# Patient Record
Sex: Male | Born: 1960 | Race: White | Hispanic: No | Marital: Married | Smoking: Never smoker
Health system: Southern US, Community
[De-identification: ages and names within clinical notes are randomized; demographics above are authoritative.]

## PROBLEM LIST (undated history)

## (undated) DIAGNOSIS — M722 Plantar fascial fibromatosis: Secondary | ICD-10-CM

## (undated) DIAGNOSIS — R011 Cardiac murmur, unspecified: Secondary | ICD-10-CM

## (undated) DIAGNOSIS — K635 Polyp of colon: Secondary | ICD-10-CM

## (undated) DIAGNOSIS — M7710 Lateral epicondylitis, unspecified elbow: Secondary | ICD-10-CM

## (undated) HISTORY — PX: TONSILLECTOMY: SUR1361

## (undated) HISTORY — DX: Polyp of colon: K63.5

## (undated) HISTORY — DX: Plantar fascial fibromatosis: M72.2

## (undated) HISTORY — DX: Lateral epicondylitis, unspecified elbow: M77.10

## (undated) HISTORY — DX: Cardiac murmur, unspecified: R01.1

## (undated) HISTORY — PX: OTHER SURGICAL HISTORY: SHX169

---

## 2007-12-21 ENCOUNTER — Ambulatory Visit: Payer: Self-pay | Admitting: Sports Medicine

## 2007-12-21 DIAGNOSIS — M79609 Pain in unspecified limb: Secondary | ICD-10-CM | POA: Insufficient documentation

## 2008-03-27 ENCOUNTER — Ambulatory Visit: Payer: Self-pay | Admitting: Sports Medicine

## 2008-03-27 DIAGNOSIS — S838X9A Sprain of other specified parts of unspecified knee, initial encounter: Secondary | ICD-10-CM | POA: Insufficient documentation

## 2008-03-27 DIAGNOSIS — S86819A Strain of other muscle(s) and tendon(s) at lower leg level, unspecified leg, initial encounter: Secondary | ICD-10-CM

## 2008-03-28 ENCOUNTER — Telehealth: Payer: Self-pay | Admitting: Sports Medicine

## 2008-04-27 ENCOUNTER — Ambulatory Visit: Payer: Self-pay | Admitting: Sports Medicine

## 2008-10-18 ENCOUNTER — Ambulatory Visit: Payer: Self-pay | Admitting: Sports Medicine

## 2008-10-18 DIAGNOSIS — M7660 Achilles tendinitis, unspecified leg: Secondary | ICD-10-CM | POA: Insufficient documentation

## 2008-11-21 ENCOUNTER — Ambulatory Visit: Payer: Self-pay | Admitting: Sports Medicine

## 2009-06-03 ENCOUNTER — Ambulatory Visit: Payer: Self-pay | Admitting: Sports Medicine

## 2009-07-02 ENCOUNTER — Ambulatory Visit: Payer: Self-pay | Admitting: Sports Medicine

## 2009-07-02 DIAGNOSIS — M722 Plantar fascial fibromatosis: Secondary | ICD-10-CM | POA: Insufficient documentation

## 2010-01-16 ENCOUNTER — Ambulatory Visit: Payer: Self-pay | Admitting: Sports Medicine

## 2010-02-06 ENCOUNTER — Ambulatory Visit: Payer: Self-pay | Admitting: Sports Medicine

## 2010-05-19 ENCOUNTER — Ambulatory Visit
Admission: RE | Admit: 2010-05-19 | Discharge: 2010-05-19 | Payer: Self-pay | Source: Home / Self Care | Attending: Sports Medicine | Admitting: Sports Medicine

## 2010-05-20 NOTE — Assessment & Plan Note (Signed)
Summary: HEEL PAIN,MC   Vital Signs:  Patient profile:   50 year old male Height:      67 inches Weight:      150 pounds BP sitting:   118 / 73  Vitals Entered By: Enid Baas MD (June 03, 2009 10:38 AM)  Primary Provider:  Roanna Epley, MD   History of Present Illness: 50 y/o male presents for F/U of Left heel pain.  Pt last visit was in August 2010.  U/S at last visit showed small hematoma at distal achilles and fat pad.  Pt states that pain is on the posterior/plantar aspect of heel near achilles insertion.  Pain is worse with initial steps in the morning and gets easier to walk as day progresses.  Pain is worse for the days following a long run but only occational pains during the run that last for no more than a couple seconds. Pt states that he is not a heel-striker when running but rather lands on his midfoot.  Pt uses prescribed heel cups and inserts in his shoes.  Uses ibuprofen and ices the left heel but only gets releif after a couple day after the long run.  Pt states that pain now lasts longer after a run and is more intense.  Pt also states that he has tight hamstrings and calves for which he does exercises and strengthening.   Physical Exam  General:  alert, well-developed, and well-nourished.   Msk:  Left Ankle/foot: -no signs of edema -TTP at plantar base of calcaneous, non-tender along plantar fascia or along achilles tendon.  -FROM -5/5 strength   Bilateral Hip/Leg/ Knee: -no leg length descrepencies -FROM -4/5 strength in hip abduction bilaterally  Additional Exam:  US exam of heel this is repeated over left heel AT is normal at 0.41 cms PF is normal at 0.35 cms fat pad no longer shows hematoma but there may be a small area of split fat pad no inc doppler flow   Impression & Recommendations:  Problem # 1:  HEEL PAIN, LEFT (ICD-729.5) Assessment Unchanged This is somewhat less than before but persistent this is most consistent with split fat  pad [place] firm [place] not sure that we have many options except heel cushion icing at end of day  Problem # 2:  ACHILLES BURSITIS OR TENDINITIS (ICD-726.71) Assessment: Improved These look normal today and I do not think this is part of pain pattern  Complete Medication List: 1)  Voltaren 1 % Gel (Diclofenac sodium) .... Apply to left heel qid 1 gram  Appended Document: HEEL PAIN,MC D/w patient.  will consider making custom orthotic if this persistes and reck [place] to be sure PF is not starting to breakdown .. suspect he changed his form from split fat pad.

## 2010-05-20 NOTE — Assessment & Plan Note (Signed)
Summary: CALF STRAIN,MC   Vital Signs:  Patient profile:   50 year old male Pulse rate:   41 / minute BP sitting:   123 / 77  (left arm) CC: left calf pain   Primary Jillisa Harris:  Roanna Epley, MD  CC:  left calf pain.  History of Present Illness: Patient with left calf pain and tightness since July.  Pain occurs mid calf, and feels deep.  Patient describes pain as an aching at first and then progresses to tightness.  Has tried icing and resting 4-5 days at a time, states the discomfort improves with rest, but progresses again with running.  Denies tearing sensation.  Does not recall specific injury or trauma.  Denies numbness/tingling.  Denies bruising or swelling.  Runs 20-25 miles per week.  Training for [facility] 1/2 Marathon 02/09/10. PMH-FH-SH reviewed for relevance  Review of Systems General:  Denies chills, fatigue, fever, weakness, and weight loss. CV:  Denies bluish discoloration of lips or nails, leg cramps with exertion, swelling of feet, and swelling of hands. Resp:  Denies chest pain with inspiration, cough, and shortness of breath. MS:  Complains of muscle aches and stiffness; denies joint pain, joint redness, joint swelling, loss of strength, low back pain, mid back pain, cramps, muscle weakness, and thoracic pain. Derm:  Denies changes in color of skin, changes in nail beds, itching, lesion(s), and rash. Neuro:  Denies brief paralysis, falling down, numbness, poor balance, tingling, tremors, and weakness. Heme:  Denies abnormal bruising and skin discoloration. Allergy:  Denies hives or rash and seasonal allergies.  Physical Exam  General:  Well-developed,well-nourished,in no acute distress; alert,appropriate and cooperative throughout examination Msk:  Left Foot and Ankle: Left calf with TTP along lateral gastroc mid-way between knee & ankle.  No palpable defects.  No visible calf asymetry.  Able to perform heel raises.  Thompson test produces expected plantar  flexion. mild TTP along medial & mid portion of plantar fascia at the heel.   Full ROM of the ankle in all directions 5/5 strength with resisted ankle ROM Mild pes planus with lateral transverse arch breakdown  Right Foot and Ankle: Normal inspection No TTP throughout, no TTP along lateral gastroc. Full ROM of the ankle in all directions 5/5 strength with resisted ankle ROM Mild pes planus with lateral transverse arch breakdown Pulses:  +2/4 DP & PT b/l Neurologic:  sensation intact to light touch.   Additional Exam:  MSK U/S: Left calf - lateral gastroc with partial tear noted midway between knee & ankle.  Increased edema noted in that area.  Increased blood flow noted with doppler imaging.  No abnormalites of achilles tendon.  Comparison to contralateral side revealed normal appearing right gastroc without abnormality.  Also normal appearing achilles on right.  Images saved.   Impression & Recommendations:  Problem # 1:  CALF PAIN, LEFT (ICD-729.5)  - Partial lateral gastroc tear noted on MSK U/S - Start NTG protocol - apply 1/4 patch to lateral calf daily.  Reviewed common side-effects. - Fitted with Body Helix calf sleeve - should wear with activity & throughout the day to decrease swelling/fluid accumulation - Additional felt lift added to lift already in shoe.  Additional lift given for other shoes. - Re-educated on eccentric calf exercises & handout given. - Ok to continue running, should stop if experiencing increased pain or if limping. - f/u prior to [facility] 1/2 Marathon which is 02/09/10.  Plan for repeat MSK U/S at that time.  Orders:  [place] LIMITED ([id number])  Problem # 2:  HEEL PAIN, LEFT (ICD-729.5) - Additional felt heel lift added to left shoe.  Additional lift given for other shoes.  Orders: Foot Orthosis ( Arch Strap/Heel Cup) [phone])  Complete Medication List: 1)  Voltaren 1 % Gel (Diclofenac sodium) .... Apply to left heel qid 1 gram 2)  Nitroglycerin 0.2  Mg/hr Pt24 (Nitroglycerin) .... Apply 1/4 patch to lateral left calf daily  Other Orders: Garment,belt,sleeve or other covering ,elastic or similar stretch (Z6606)  Patient Instructions: 1)  You have a small partial calf tear noted on ultrasound. 2)  Start using Nitroglycerin patch - 1/4 patch to left lateral calf in area of tenderness daily.  Re-apply new patch daily.  Most common side-effect is headache. 3)  Use calf sleeve with activity to decrease swelling & fluid accumulation. 4)  Wear new heel lift in left shoe, apply additional lift in other shoes. 5)  Do calf exercises 3 sets of 15 of calf raises. 6)  Ok to continue running/training, but should stop if having increased pain and/or limping. 7)  Follow-up prior to half marathon which is 02/09/10. Prescriptions: NITROGLYCERIN 0.2 MG/HR PT24 (NITROGLYCERIN) apply 1/4 patch to lateral left calf daily  #12 x 2   Entered and Authorized by:   Darene Lamer MD   Signed by:   Darene Lamer MD on 01/16/2010   Method used:   Electronically to        [facility] [phone]* (retail)       [address] [place], [place]  [address]       Ph: [phone]       Fax: [phone]   RxID:   [id number]

## 2010-05-20 NOTE — Assessment & Plan Note (Signed)
Summary: F/U HEEL PAIN,MC   Vital Signs:  Patient profile:   50 year old male Pulse rate:   46 / minute BP sitting:   126 / 84  Vitals Entered By: Lillia Pauls CMA (July 02, 2009 9:35 AM)  Primary Provider:  Roanna Epley, MD   History of Present Illness: Pt presents for left posterior heel pain that has been worse for the past three weeks since he ran a half marathon in [facility]. He had previously been found to have a small hematoma of his left posterior heel that was near his achilles tendon after walking quickly through a [place] airport. The pain that he currently has is different and worse than his previous pain. He now has difficulty with walking and running. His pain is worse with his first steps in the morning and after sitting for a while. His pain is located on the plantar surface of his left foot at his heel.   Physical Exam  General:  alert and well-developed.   Head:  normocephalic and atraumatic.   Neck:  supple.   Heart:  normal rate.   Msk:  Left Foot and Ankle: Normal inspection + TTP along the medial and mid portion of the plantar fascia at the heel Full ROM of the ankle in all directions 5/5 strength with resisted ankle ROM Pain with weight bearing Mild pes planus with lateral transverse arch breakdown  Right Foot and Ankle: Normal inspection No TTP throughout Full ROM of the ankle in all directions 5/5 strength with resisted ankle ROM No pain with weight bearing Mild pes planus with lateral transverse arch breakdown Additional Exam:  U/S of the left achilles tendon shows a small gap on the transverse view with mild edema and limited doppler flow. Examinaiton of his left plantar fascia shows a small gap at the insertion point with a small amount of calcification. Mildly increased doppler flow is appreciated. Thickness of left plantar fascia is 0.34cm. Exam of the right plantar fasica shows no edema, tears or calcification. Thickness of the plantar fascia is  0.33cm.  Images saved for documentation.   Impression & Recommendations:  Problem # 1:  HEEL PAIN, LEFT (ICD-729.5)  Mainly due to his plantar fasciitis of the left heel See below for treatment plan  Orders: [place] LIMITED ([id number]) Garment,belt,sleeve or other covering ,elastic or similar stretch (U0454)  Problem # 2:  PLANTAR FASCIITIS (ICD-728.71)  1. Continue current sports insoles in shoes at all times. 2. Given a new heel pad to wear in his dress shoes today. 3. Given an arch band to wear at all times 4. Continue calf and arch stretches and exercises as previously shown 5. Ice foot in bucket of ice water for 10 minutes daily 6. Return in one month for follow-up  Orders: [place] LIMITED ([id number]) Garment,belt,sleeve or other covering ,elastic or similar stretch (B1478)  Problem # 3:  ACHILLES BURSITIS OR TENDINITIS (ICD-726.71)  Improved since last visit. Small hematoma still seen with a small gap on ultrasound. Continue heel pads and sports insoles with daily exercises.  Orders: [place] LIMITED ([id number])  Complete Medication List: 1)  Voltaren 1 % Gel (Diclofenac sodium) .... Apply to left heel qid 1 gram

## 2010-05-20 NOTE — Assessment & Plan Note (Signed)
Summary: f/u,mc   Vital Signs:  Patient profile:   50 year old male Height:      67 inches Weight:      153 pounds BMI:     24.05 BP sitting:   120 / 80  Vitals Entered By: Lillia Pauls CMA (February 06, 2010 10:00 AM)  Primary Provider:  Roanna Epley, MD   History of Present Illness: He found extra lift on left too much still using sports insole w heel lift at 1 cm  calf compression sleeve very comfortable  no pain on 10 mile run this week did gets some DOMS 1 hour p run  exercises have gone well on 2 legs  Allergies (verified): No Known Drug Allergies  Physical Exam  General:  Well-developed,well-nourished,in no acute distress; alert,appropriate and cooperative throughout examination Msk:  no calf tenderness or swelling  normal appearance  MSK [place] There is increase in doppler flow and vessels into area of injury compared to last vist hypoechoic area is smaller there appears to be better mm fiber in area there is still  1 section that remains mostly hypoechoic   Impression & Recommendations:  Problem # 1:  CALF PAIN, LEFT (ICD-729.5)  use calf compression on both calves Will add to RT as this one is getting somewhat tight but not tender like left was  work exercises to 1 leg after half marathon done  keep on ntg another 6 wks and reck after that  will repeat scan on rtc to see if hypoechoic area totally heals but may be able to wean ntg at that point as this seems to be healing progressively  Orders: [place] LIMITED ([id number])  Complete Medication List: 1)  Voltaren 1 % Gel (Diclofenac sodium) .... Apply to left heel qid 1 gram 2)  Nitroglycerin 0.2 Mg/hr Pt24 (Nitroglycerin) .... Apply 1/4 patch to lateral left calf daily  Other Orders: Garment,belt,sleeve or other covering ,elastic or similar stretch (U0454)   Orders Added: 1)  Garment,belt,sleeve or other covering ,elastic or similar stretch [A4466] 2)  Est. Patient Level III [09811] 3)  [place] LIMITED  [[id number]]

## 2010-05-28 NOTE — Assessment & Plan Note (Signed)
Summary: CALF PAIN/ACHILLES TENDON/MJD   Vital Signs:  Patient profile:   50 year old male BP sitting:   132 / 83  Vitals Entered By: Lillia Pauls CMA (May 19, 2010 11:11 AM)  Primary Provider:  Roanna Epley, MD   History of Present Illness: Left AT and calf had healed as of Oct and he ran half marathon  2 weeks ago running a 9 miler and did not drink RT calf felt tight toward end of run later had pain in lower calf and into AT whenever he tried to run again not able to run last 2 wks had been biking and doing pool running  Ran mile for [employer] - 4:18    Allergies: No Known Drug Allergies  Physical Exam  General:  Well-developed,well-nourished,in no acute distress; alert,appropriate and cooperative throughout examination Msk:  on heel raise and lowering on step he can get just past neutral but minimal dorsiflexion of ankle Tender at intersection of gastrocs / top of AT on RT about 6 in above insertion no TTP over AT itselg good strength and definition  RT is now unremarkable Additional Exam:  MSK [place] AT is normal at 0.47 w no tears inetersection area reveals some sclerotic - prob scar tissue there is inc doppler flow and small defect in this area no tear of fascia or tendon noted   Impression & Recommendations:  Problem # 1:  CALF PAIN, BILATERAL (ICD-729.5)  The RT is now problematic while left is doing well  calf sleeves help  Orders: [place] LIMITED ([id number])  Problem # 2:  ACHILLES BURSITIS OR TENDINITIS (ICD-726.71)  high area of tendon near intersection is painful and partial tearing  restart calf exercises  trial of NTG protocal on RT - worked on left  modify workouts w xtrain x 12 more week and tehn ease bak in  rescn 6 wks  Orders: [place] LIMITED ([id number])  Complete Medication List: 1)  Voltaren 1 % Gel (Diclofenac sodium) .... Apply to left heel qid 1 gram 2)  Nitroglycerin 0.2 Mg/hr Pt24 (Nitroglycerin) .... 1/4 patch daily x 24 hours as  directed Prescriptions: NITROGLYCERIN 0.2 MG/HR PT24 (NITROGLYCERIN) 1/4 patch daily x 24 hours as directed  #30 x 1   Entered and Authorized by:   Enid Baas MD   Signed by:   Enid Baas MD on 05/19/2010   Method used:   Electronically to        [facility] [phone]* (retail)       [address] [place], [place]  [address]       Ph: [phone]       Fax: [phone]   RxID:   [id number]    Orders Added: 1)  Est. Patient Level III [36644] 2)  [place] LIMITED [[id number]]

## 2010-06-11 ENCOUNTER — Encounter (INDEPENDENT_AMBULATORY_CARE_PROVIDER_SITE_OTHER): Payer: Self-pay | Admitting: *Deleted

## 2010-06-17 NOTE — Miscellaneous (Signed)
  Clinical Lists Changes  Orders: Added new Referral order of Physical Therapy Referral (PT) - Signed     Pt requested referral to [facility] in [place]- [phone].  Authorized by Dr. Darrick Penna.

## 2010-07-31 ENCOUNTER — Ambulatory Visit (INDEPENDENT_AMBULATORY_CARE_PROVIDER_SITE_OTHER): Payer: PRIVATE HEALTH INSURANCE | Admitting: Sports Medicine

## 2010-07-31 ENCOUNTER — Encounter: Payer: Self-pay | Admitting: Sports Medicine

## 2010-07-31 VITALS — BP 119/80

## 2010-07-31 DIAGNOSIS — G5790 Unspecified mononeuropathy of unspecified lower limb: Secondary | ICD-10-CM

## 2010-07-31 DIAGNOSIS — M792 Neuralgia and neuritis, unspecified: Secondary | ICD-10-CM

## 2010-07-31 DIAGNOSIS — M79609 Pain in unspecified limb: Secondary | ICD-10-CM

## 2010-07-31 NOTE — Assessment & Plan Note (Signed)
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 The Achilles tendon itself looks completely normal with no abnormal Doppler activity in the examination is completely normal. With this in mind we will try conservative approach using heat compression wraps vitamin B6 and topical capsacian cream to the calf. He is to try this one month. If no improvement I would probably use a different medication to block the nerve irritation.

## 2010-07-31 NOTE — Assessment & Plan Note (Signed)
Patient has had recurrent calf strains. The most recent one which involved the right lateral gastrocnemius muscle seems to have healed well but the scar tissue is in the vicinity of the sural nerve. I suspect based on abnormal Doppler activity that this is causing some irritation of the nerve during exercise.

## 2010-07-31 NOTE — Progress Notes (Signed)
  Subjective:    Patient ID: Roberto Martin, male    DOB: Sep 14, 1960, 50 y.o.   MRN: [id number]  HPI  Pt presents to clinic for evaluation of rt achilles pain.  Aches during runs, does not have to stop running due to pain.  Does not hurt after runs, and does not experience swelling.  Continues with HEP without problems.  Next atopic pain presented after his recent right lateral gastroc tear. The nitroglycerin patches seem to work well with that and the tear healed fairly quickly. However shortly after he experienced a calf tear he was having some of the Del radiating pain down to his Achilles tendon. This does not hurt him at night.   Review of Systems     Objective:   Physical Exam     No swelling either AT Neither is tender Neg thompson's test Good ankle motion bilat Normal gait Good post tib function  Musculoskeletal ultrasound Scans of the right Achilles tendon reveals a normal AP diameter of 0.4 cm. The tendon is intact. There is no abnormal Doppler activity and no swelling noted. Scan of the right lateral gastrocnemius does reveal an area of some scar tissue. This sits proximal to the sural nerve. Of interest there is still no multivessel activity all around this scar tissue   Assessment & Plan:

## 2010-07-31 NOTE — Patient Instructions (Addendum)
Start taking Vitamin B6 100 mg daily Use compression sleeves Do not apply ice to calf Massage Capsaicin cream on calf 2-3 times per day at area of previous strain Let us know how this is working in 4 weeks

## 2010-09-03 ENCOUNTER — Ambulatory Visit (INDEPENDENT_AMBULATORY_CARE_PROVIDER_SITE_OTHER): Payer: PRIVATE HEALTH INSURANCE | Admitting: Sports Medicine

## 2010-09-03 ENCOUNTER — Encounter: Payer: Self-pay | Admitting: Sports Medicine

## 2010-09-03 VITALS — BP 125/81

## 2010-09-03 DIAGNOSIS — M722 Plantar fascial fibromatosis: Secondary | ICD-10-CM

## 2010-09-03 DIAGNOSIS — M7660 Achilles tendinitis, unspecified leg: Secondary | ICD-10-CM

## 2010-09-03 NOTE — Assessment & Plan Note (Signed)
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 Today we modified his sports and soles but cutting a doughnut over the cystic area. He is to try using this and also running on the treadmill the next 2 weeks. If we can cushion this so that he does not get pressure over the bony defect I think he will be able to run without significant pain.  He needs to avoid slopes surfaces were extremely hard surfaces so that he does not reinjure this calcaneal cyst.

## 2010-09-03 NOTE — Progress Notes (Signed)
  Subjective:    Patient ID: Roberto Martin, male    DOB: Apr 20, 1961, 50 y.o.   MRN: [id number]  HPI Roberto Martin returns for followup of a tear in the distal calf close to the right Achilles tendon. This is improved as he has done the exercises and he briefly used nitroglycerin until most the pain was gone. He is using to person cells with a bit of a heel lift.  However several times per week he may run on an indoor track that is about 12 laps to the mile and is banked.  He has a history of chronic left heel pain which in the past was diagnosed as plantar fasciitis. However this has never completely resolved over the past 2 years. After a 4 mi run last week he has severe pain in the left heel that may difficult to walk. He used the [employer] which helped somewhat the pain.  He has moved to running on the treadmill and that is much less painful.   Review of Systems     Objective:   Physical Exam    patient is in no acute distress and is pleasant and cooperative Right calf appears normal to inspection. The Achilles tendon is nontender and is normal to palpation. No nodules are seen. He has good strength.  Left heel shows no pain at the insertion of the plantar fascia. There is some direct tenderness just lateral to the os calcis in the middle of the heel. No swelling is noted.  Musculoskeletal ultrasound Right calf shows what looks like some scar tissue in the area of his prior injury. There is still a very slight increase in Doppler flow. There is no evidence of tearing. The Achilles tendon looks normal and measures to be 0.4 cm.  Left heel shows a normal-appearing plantar fascia with a width of 0.4 cm. Just distal to the insertion of the plantar fascia there some hypoechoic change in the soft tissue and an area of linear hypoechoic change that looks like a fixed split fat pad. Just lateral to the os calcis is an area of cystic irregularity in the calcaneus that now has calcific change  surrounding the defect.    Assessment & Plan:

## 2010-09-03 NOTE — Assessment & Plan Note (Signed)
This is much improved so I suggested that he continue using the same inserts. Continue  calf exercises. I would suggest running more on soft surfaces and getting off the indoor track.

## 2011-04-07 ENCOUNTER — Ambulatory Visit (INDEPENDENT_AMBULATORY_CARE_PROVIDER_SITE_OTHER): Payer: PRIVATE HEALTH INSURANCE | Admitting: Sports Medicine

## 2011-04-07 ENCOUNTER — Encounter: Payer: Self-pay | Admitting: Sports Medicine

## 2011-04-07 VITALS — BP 127/84 | HR 52

## 2011-04-07 DIAGNOSIS — M79609 Pain in unspecified limb: Secondary | ICD-10-CM

## 2011-04-07 DIAGNOSIS — M722 Plantar fascial fibromatosis: Secondary | ICD-10-CM

## 2011-04-07 NOTE — Assessment & Plan Note (Signed)
I recommended continuing to use padding that makes her feel more comfortable  Try an arch strep to see if this is helpful  Easy plantar fascia stretches and continue his calf exercises  I don't think this will go away because it seems to involve the bone and not the plantar fascia  Recheck when necessary

## 2011-04-07 NOTE — Progress Notes (Signed)
  Subjective:    Patient ID: Roberto Martin, male    DOB: 10-26-1960, 50 y.o.   MRN: [id number]  HPI  Patient was last seen in May with left heel pain We'll set her originally, when he was running a lot but the pain started when he was walking through an airport where he had to do a lot of walking He had pain directly under his heel This did not respond to standard treatments  When I saw him in May he does look to be a bony injury to his os calcis with a bone cyst present on his ultrasound scan  He now has some morning stiffness and pain and wonders if he could be having plantar fasciitis or if this is still the same problem He was able to round but does better if he runs every other day  Review of Systems     Objective:   Physical Exam  No acute distress Good calf strength left and right Moderate arch No tenderness on palpation of the plantar fascia itself There is some tenderness in the mid left calcaneus near the os calcis No swelling  MSK ultrasound The plantar fascial membrane continues to look normal He still has a bony defect in the calcaneus at the os calcis This looks like a small erosion or bone cyst and possibly could be an incompletely healed chronic minor fracture      Assessment & Plan:

## 2011-07-28 ENCOUNTER — Ambulatory Visit (INDEPENDENT_AMBULATORY_CARE_PROVIDER_SITE_OTHER): Payer: Managed Care, Other (non HMO) | Admitting: Sports Medicine

## 2011-07-28 VITALS — BP 112/70

## 2011-07-28 DIAGNOSIS — M25529 Pain in unspecified elbow: Secondary | ICD-10-CM | POA: Insufficient documentation

## 2011-07-28 DIAGNOSIS — M7711 Lateral epicondylitis, right elbow: Secondary | ICD-10-CM | POA: Insufficient documentation

## 2011-07-28 DIAGNOSIS — M7710 Lateral epicondylitis, unspecified elbow: Secondary | ICD-10-CM

## 2011-07-28 MED ORDER — NITROGLYCERIN 0.2 MG/HR TD PT24: MEDICATED_PATCH | TRANSDERMAL | Status: DC

## 2011-07-28 NOTE — Assessment & Plan Note (Signed)
Use compression sleeve during day  Not severe and will not consider injection unless no response to other Tx  Use OTC meds

## 2011-07-28 NOTE — Patient Instructions (Addendum)
Please restart 1/4 nitroglycerin patch on right elbow daily.  Nitroglycerin Protocol   Apply 1/4 nitroglycerin patch to affected area daily.  Change position of patch within the affected area every 24 hours.  You may experience a headache during the first 1-2 weeks of using the patch, these should subside.  If you experience headaches after beginning nitroglycerin patch treatment, you may take your preferred over the counter pain reliever.  Another side effect of the nitroglycerin patch is skin irritation or rash related to patch adhesive.  Please notify our office if you develop more severe headaches or rash, and stop the patch.  Tendon healing with nitroglycerin patch may require 12 to 24 weeks depending on the extent of injury.  Men should not use if taking Viagra, Cialis, or Levitra.   Do not use if you have migraines or rosacea.   Please do suggested exercises daily.  Follow up in 6 weeks  Thank you for seeing [place] today!

## 2011-07-28 NOTE — Assessment & Plan Note (Signed)
Begin a NTG protocol  Begin eccentric exercise protocol  Repeat scan in 4 to 6 weeks and reck at that visit

## 2011-07-28 NOTE — Progress Notes (Signed)
  Subjective:    Patient ID: Roberto Martin, male    DOB: 1960/09/15, 51 y.o.   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

## 2011-09-08 ENCOUNTER — Ambulatory Visit: Payer: Managed Care, Other (non HMO) | Admitting: Sports Medicine

## 2011-09-24 ENCOUNTER — Ambulatory Visit: Payer: Managed Care, Other (non HMO) | Admitting: Sports Medicine

## 2011-10-02 ENCOUNTER — Encounter: Payer: Self-pay | Admitting: Family Medicine

## 2011-10-02 ENCOUNTER — Ambulatory Visit (INDEPENDENT_AMBULATORY_CARE_PROVIDER_SITE_OTHER): Payer: Managed Care, Other (non HMO) | Admitting: Family Medicine

## 2011-10-02 VITALS — BP 119/83 | HR 49 | Temp 97.8°F | Ht 67.0 in | Wt 155.0 lb

## 2011-10-02 DIAGNOSIS — M7710 Lateral epicondylitis, unspecified elbow: Secondary | ICD-10-CM

## 2011-10-02 DIAGNOSIS — M7711 Lateral epicondylitis, right elbow: Secondary | ICD-10-CM

## 2011-10-02 NOTE — Patient Instructions (Addendum)
You have lateral epicondylitis Try to avoid painful activities as much as possible (wrist, finger extension, turning hand to the outside). Ice the area 3-4 times a day for 15 minutes at a time. Tylenol or aleve as needed for pain. Counterforce brace as directed can help unload area - wear this regularly if it provides you with relief. Home Pronation/supination with 1 pound weight, wrist extension, stretching - do these once a day - work up to 3 sets of 10 of strengthening exercises, 3-5 stretches holding for 10-20 seconds. Consider formal PT. Consider injection for short term pain relief if the above is not helping. If you use the patches don't use viagra for at least 5 days after stopping them - they will bottom out your blood pressure if you use them together. Surgery is a consideration if exercises and injection are not providing enough relief. Follow up with me in 6 weeks for reevaluation.

## 2011-10-03 ENCOUNTER — Encounter: Payer: Self-pay | Admitting: Family Medicine

## 2011-10-03 NOTE — Assessment & Plan Note (Signed)
Counterforce brace provided.  Emphasized need to do home exercises and demonstrated these again.  He was recently prescribed viagra but hasn't taken - stressed that he cannot take this and do nitro patches.  He's going to focus on exercises, icing, nsaids, tylenol as needed.  F/u in 6 weeks for reevaluation.  Consider trying the patches, injection, formal PT if not improving.

## 2011-10-03 NOTE — Progress Notes (Signed)
  Subjective:    Patient ID: Roberto Martin, male    DOB: [birth year], 51 y.o.   MRN: [id number]  PCP: [facility]  HPI 51 yo M here for right elbow pain.  Patient reports pain started 3 months ago. Thinks it started after he was moving furniture. Difficulty gripping anything heavy with his right hand. Using mouse bothers his elbow. Pain lateral elbow that radiates down some. Writes with right hand, throws with left. No swelling or bruising. Occasionally taking ibuprofen. Seen at [facility] office - shown home exercises (not doing regularly) and given rx for nitroglycerin (has tried a couple times). Feels about the same as last visit.  History reviewed. No pertinent past medical history.  Current Outpatient Prescriptions on File Prior to Visit  Medication Sig Dispense Refill  . nitroGLYCERIN (NITRODUR - DOSED IN MG/24 HR) 0.2 mg/hr 1/4 patch daily.  Change patch every 24 hours as directed.  30 patch  1    History reviewed. No pertinent past surgical history.  No Known Allergies  History   Social History  . Marital Status: Single    Spouse Name: N/A    Number of Children: N/A  . Years of Education: N/A   Occupational History  . Not on file.   Social History Main Topics  . Smoking status: Never Smoker   . Smokeless tobacco: Not on file  . Alcohol Use: Not on file  . Drug Use: Not on file  . Sexually Active: Not on file   Other Topics Concern  . Not on file   Social History Narrative  . No narrative on file    Family History  Problem Relation Age of Onset  . Heart attack Father   . Hyperlipidemia Neg Hx   . Hypertension Neg Hx   . Sudden death Neg Hx     BP 119/83  Pulse 49  Temp 97.8 F (36.6 C) (Oral)  Ht 5\' 7"  (1.702 m)  Wt 155 lb (70.308 kg)  BMI 24.28 kg/m2  Review of Systems See HPI above.    Objective:   Physical Exam Gen: NAD  R elbow: No swelling, bruising, deformity. TTP lateral epicondyle and just distal to this.  No other TTP about  elbow. FROM. Collateral ligaments intact. Pain reproduced with wrist and 3rd finger extension, less so with supination. NVI distally.  L elbow: FROM without pain.    Assessment & Plan:  1. Right elbow lateral epicondylitis - Counterforce brace provided.  Emphasized need to do home exercises and demonstrated these again.  He was recently prescribed viagra but hasn't taken - stressed that he cannot take this and do nitro patches.  He's going to focus on exercises, icing, nsaids, tylenol as needed.  F/u in 6 weeks for reevaluation.  Consider trying the patches, injection, formal PT if not improving.

## 2012-02-24 ENCOUNTER — Ambulatory Visit (INDEPENDENT_AMBULATORY_CARE_PROVIDER_SITE_OTHER): Payer: Managed Care, Other (non HMO) | Admitting: Family Medicine

## 2012-02-24 VITALS — BP 118/84 | Ht 67.0 in | Wt 155.0 lb

## 2012-02-24 DIAGNOSIS — IMO0002 Reserved for concepts with insufficient information to code with codable children: Secondary | ICD-10-CM

## 2012-02-24 DIAGNOSIS — S76311A Strain of muscle, fascia and tendon of the posterior muscle group at thigh level, right thigh, initial encounter: Secondary | ICD-10-CM

## 2012-02-25 ENCOUNTER — Encounter: Payer: Self-pay | Admitting: Family Medicine

## 2012-02-25 DIAGNOSIS — S76311A Strain of muscle, fascia and tendon of the posterior muscle group at thigh level, right thigh, initial encounter: Secondary | ICD-10-CM | POA: Insufficient documentation

## 2012-02-25 NOTE — Assessment & Plan Note (Signed)
Shown home exercises and handout provided.  Has a compression wrap - encouraged to use this with exercise.  Discussed heat/ice.  Avoid hills, speed work for next 4 weeks as he rehabilitates this area.  Nsaids, tylenol as needed.  F/u in 1 month to 6 weeks.  Consider formal PT, nitro if not improving as expected.

## 2012-02-25 NOTE — Progress Notes (Signed)
  Subjective:    Patient ID: Roberto Martin, male    DOB: [birth year], 51 y.o.   MRN: [id number]  PCP: [facility]  HPI 51 yo M here for right hamstring pain  Patient reports about 2 months ago he recalls tweaking right hamstring during a run within middle part of hamstring. No bruising or swelling. Has done some home exercises, wrapping, stretching and overall has been improving. However still gets tightness, pain especially after doing a couple days of running back to back. Hurts with firm palpation of the area (like when sitting on toilet). Not taking any medications for this. Ices area.  History reviewed. No pertinent past medical history.  Current Outpatient Prescriptions on File Prior to Visit  Medication Sig Dispense Refill  . nitroGLYCERIN (NITRODUR - DOSED IN MG/24 HR) 0.2 mg/hr 1/4 patch daily.  Change patch every 24 hours as directed.  30 patch  1    History reviewed. No pertinent past surgical history.  No Known Allergies  History   Social History  . Marital Status: Single    Spouse Name: N/A    Number of Children: N/A  . Years of Education: N/A   Occupational History  . Not on file.   Social History Main Topics  . Smoking status: Never Smoker   . Smokeless tobacco: Not on file  . Alcohol Use: Not on file  . Drug Use: Not on file  . Sexually Active: Not on file   Other Topics Concern  . Not on file   Social History Narrative  . No narrative on file    Family History  Problem Relation Age of Onset  . Heart attack Father   . Hyperlipidemia Neg Hx   . Hypertension Neg Hx   . Sudden death Neg Hx     BP 118/84  Ht 5\' 7"  (1.702 m)  Wt 155 lb (70.308 kg)  BMI 24.28 kg/m2  Review of Systems See HPI above.    Objective:   Physical Exam Gen: NAD  R leg: No gross deformity, swelling, bruising, atrophy. TTP mildly midportion post thigh between hamstrings.  No palpable defect. FROM knee. 5/5 strength with knee flexion at 90 and 30 degrees with  mild pain. NVI distally.    Assessment & Plan:  1. Right hamstring strain - Shown home exercises and handout provided.  Has a compression wrap - encouraged to use this with exercise.  Discussed heat/ice.  Avoid hills, speed work for next 4 weeks as he rehabilitates this area.  Nsaids, tylenol as needed.  F/u in 1 month to 6 weeks.  Consider formal PT, nitro if not improving as expected.

## 2012-09-06 ENCOUNTER — Encounter: Payer: Self-pay | Admitting: Family Medicine

## 2012-09-06 ENCOUNTER — Ambulatory Visit: Payer: Managed Care, Other (non HMO) | Admitting: Family Medicine

## 2012-09-06 ENCOUNTER — Ambulatory Visit (INDEPENDENT_AMBULATORY_CARE_PROVIDER_SITE_OTHER): Payer: Managed Care, Other (non HMO) | Admitting: Family Medicine

## 2012-09-06 VITALS — BP 124/82 | HR 50 | Ht 67.0 in | Wt 155.0 lb

## 2012-09-06 DIAGNOSIS — M79605 Pain in left leg: Secondary | ICD-10-CM

## 2012-09-06 DIAGNOSIS — M79609 Pain in unspecified limb: Secondary | ICD-10-CM

## 2012-09-06 NOTE — Patient Instructions (Addendum)
You have shin splints, less likely a stress reaction. I would recommend taking 4 weeks off from running and walking for exercise. Focus on cross training with non-impact activities (cycling, swimming, elliptical). Take tylenol and/or aleve as needed for pain. Icing 3-4 times a day and after activity for 15 minutes at a time Use the insoles in your running shoes when you return to running. In 4 weeks start with a walk: jog program - 1 minute jog to 1 minute walk for 10 minutes - every other day increase total running time by 5 minutes and jog time by 1 minute (so 2:1 jog: walk for 15 minutes second run; 3:1 ZOX:WRUE for 20 minutes third run). Follow up with me in 6 weeks or as needed.

## 2012-09-07 ENCOUNTER — Encounter: Payer: Self-pay | Admitting: Family Medicine

## 2012-09-07 DIAGNOSIS — M79605 Pain in left leg: Secondary | ICD-10-CM | POA: Insufficient documentation

## 2012-09-07 NOTE — Progress Notes (Signed)
  Subjective:    Patient ID: Roberto Martin, male    DOB: [birth year], 52 y.o.   MRN: [id number]  PCP: [facility]  HPI 52 yo M here for left shin pain.  Patient reports about 2 weeks ago during a warmup jog he felt sharp pain medial lower left shin. Took 3-5 days off and went back to running. Seems to ache in this area all the time, some better with running. Has been icing. Using sports insoles. Has new shoes of the same type as prior shoes (even tried going back to his old shoes and no change in his pain). No prior history of stress fracture.  History reviewed. No pertinent past medical history.  No current outpatient prescriptions on file prior to visit.   No current facility-administered medications on file prior to visit.    History reviewed. No pertinent past surgical history.  No Known Allergies  History   Social History  . Marital Status: Single    Spouse Name: N/A    Number of Children: N/A  . Years of Education: N/A   Occupational History  . Not on file.   Social History Main Topics  . Smoking status: Never Smoker   . Smokeless tobacco: Not on file  . Alcohol Use: Not on file  . Drug Use: Not on file  . Sexually Active: Not on file   Other Topics Concern  . Not on file   Social History Narrative  . No narrative on file    Family History  Problem Relation Age of Onset  . Heart attack Father   . Hyperlipidemia Neg Hx   . Hypertension Neg Hx   . Sudden death Neg Hx   . Diabetes Neg Hx     BP 124/82  Pulse 50  Ht 5\' 7"  (1.702 m)  Wt 155 lb (70.308 kg)  BMI 24.27 kg/m2  Review of Systems See HPI above.    Objective:   Physical Exam Gen: NAD  L lower leg: No gross deformity, swelling, bruising. TTP over a 7cm long area of mid-lower 1/3rd tibia.  No other tenderness. FROM ankle without pain - 5/5 strength all directions. Negative hop test. Negative thompsons, ant drawer, talar tilt.  MSK u/s: No cortical irregularities, edema,  neovascularity to suggest a stress fracture.    Assessment & Plan:  1. Left leg pain - consistent with shin splints.  Negative hop test, description of pain (more acute in nature, able to run with this), length of tenderness, and normal ultrasound all reassuring.  Discussed very low level stress reaction could give similar presentation.  Regardless recommended about 4 weeks of cross training and no running/walking for exercise.  Icing, tylenol/aleve as needed.  Continue sports insoles.  Walk:jog program when he returns to running.  F/u in 6 weeks if still having issues.

## 2012-09-07 NOTE — Assessment & Plan Note (Signed)
consistent with shin splints.  Negative hop test, description of pain (more acute in nature, able to run with this), length of tenderness, and normal ultrasound all reassuring.  Discussed very low level stress reaction could give similar presentation.  Regardless recommended about 4 weeks of cross training and no running/walking for exercise.  Icing, tylenol/aleve as needed.  Continue sports insoles.  Walk:jog program when he returns to running.  F/u in 6 weeks if still having issues.

## 2013-01-04 ENCOUNTER — Ambulatory Visit (INDEPENDENT_AMBULATORY_CARE_PROVIDER_SITE_OTHER): Payer: Managed Care, Other (non HMO) | Admitting: Family Medicine

## 2013-01-04 ENCOUNTER — Encounter: Payer: Self-pay | Admitting: Family Medicine

## 2013-01-04 VITALS — BP 121/84 | HR 56 | Ht 67.0 in | Wt 155.0 lb

## 2013-01-04 DIAGNOSIS — M79605 Pain in left leg: Secondary | ICD-10-CM

## 2013-01-04 DIAGNOSIS — M79609 Pain in unspecified limb: Secondary | ICD-10-CM

## 2013-01-04 NOTE — Patient Instructions (Addendum)
Do home exercises 3 sets of 10 once a day regularly next 6 weeks. Hamstring curls, swings, and running lunges. Add ankle weights if these become too easy. Compression sleeve when exercising. Start formal physical therapy - we'll send in a referral to the one at [facility] for you. Aleve or ibuprofen as needed for pain and inflammation. Heat for 15 minutes before exercising, icing afterwards. 1/4th nitro patch and change daily. Follow up with me in 6 weeks for reevaluation.

## 2013-01-06 ENCOUNTER — Encounter: Payer: Self-pay | Admitting: Family Medicine

## 2013-01-06 NOTE — Assessment & Plan Note (Signed)
consistent with resolving hamstring strain.  Reviewed home exercises.  Continue hamstring sleeve.  Will add nitro patches as well (has some left over from prior injury - will ensure they have not expired).  Start formal PT.  F/u in 6 weeks.

## 2013-01-06 NOTE — Progress Notes (Signed)
Patient ID: Roberto Martin, male   DOB: 27-Jul-1960, 52 y.o.   MRN: [id number]  PCP: No primary provider on file.  Subjective:   HPI: Patient is a 52 y.o. male here for left hamstring pain.  Patient previously had issues with right hamstring but not with left. Reports about 2 months ago while running felt a strain within proximal left hamstring. No bruising or swelling. Since then has been able to run with this though discomfort is present. Recalls doing a faster pace than usual when he injured this. Has improved some since the injury, doesn't cramp up. Doing some lunges, using sleeve which have helped. Icing some as well.  History reviewed. No pertinent past medical history.  No current outpatient prescriptions on file prior to visit.   No current facility-administered medications on file prior to visit.    History reviewed. No pertinent past surgical history.  No Known Allergies  History   Social History  . Marital Status: Single    Spouse Name: N/A    Number of Children: N/A  . Years of Education: N/A   Occupational History  . Not on file.   Social History Main Topics  . Smoking status: Never Smoker   . Smokeless tobacco: Not on file  . Alcohol Use: Not on file  . Drug Use: Not on file  . Sexual Activity: Not on file   Other Topics Concern  . Not on file   Social History Narrative  . No narrative on file    Family History  Problem Relation Age of Onset  . Heart attack Father   . Hyperlipidemia Neg Hx   . Hypertension Neg Hx   . Sudden death Neg Hx   . Diabetes Neg Hx     BP 121/84  Pulse 56  Ht 5\' 7"  (1.702 m)  Wt 155 lb (70.308 kg)  BMI 24.27 kg/m2  Review of Systems: See HPI above.    Objective:  Physical Exam:  Gen: NAD  L leg: No gross deformity, swelling, bruising. No focal TTP though points to proximal hamstring area as place of pain when this comes on. FROM hip, knee without pain. 5/5 strength including knee flexion at 90 and 30  degrees. NVI distally.    Assessment & Plan:  1. Left leg pain - consistent with resolving hamstring strain.  Reviewed home exercises.  Continue hamstring sleeve.  Will add nitro patches as well (has some left over from prior injury - will ensure they have not expired).  Start formal PT.  F/u in 6 weeks.

## 2013-01-10 ENCOUNTER — Other Ambulatory Visit: Payer: Self-pay | Admitting: *Deleted

## 2013-02-23 ENCOUNTER — Other Ambulatory Visit: Payer: Self-pay

## 2013-05-16 ENCOUNTER — Encounter: Payer: Self-pay | Admitting: Sports Medicine

## 2013-05-16 ENCOUNTER — Ambulatory Visit (INDEPENDENT_AMBULATORY_CARE_PROVIDER_SITE_OTHER): Payer: Managed Care, Other (non HMO) | Admitting: Sports Medicine

## 2013-05-16 VITALS — BP 128/84 | Ht 67.0 in | Wt 155.0 lb

## 2013-05-16 DIAGNOSIS — S86819A Strain of other muscle(s) and tendon(s) at lower leg level, unspecified leg, initial encounter: Principal | ICD-10-CM

## 2013-05-16 DIAGNOSIS — S838X9A Sprain of other specified parts of unspecified knee, initial encounter: Secondary | ICD-10-CM

## 2013-05-16 NOTE — Progress Notes (Signed)
   Subjective:    Patient ID: Roberto Martin, male    DOB: [birth year], 53 y.o.   MRN: [id number]  HPI Roberto Martin is a pleasant 53 year-old male who presents today complaining of left superior hamstring pain for four months.  Describes as a "grabbing" aching sensation when he runs and after sitting/resting for any length of time becomes sore.  Generally, he runs about 4-5 miles every other day.  He states that he lightened up on his running regimen and even took two weeks off over the holidays, after which his hamstring became even more sore.  He has tried home exercises, PT, and massage.  States that he uses calf and hamstring sleeve when he runs.  Ibuprofen has helped pain some.  Pain does not radiate.  Has had RT HS injury in past   Review of Systems As above.    Objective:   Physical Exam General: NAD, well-developed; well-nourished HEENT: [place]/AT; PER Respiratory: work of breathing unlabored. MSK: Left upper hamstring weakness noted when resisted with knee flexed at 120 degrees.  Otherwise good strength.  No deformity noted.  Neurovascularly intact distally.  On running gait analysis, push-off with right foot > left foot.   Neuro: AAOx3; CN grossly intact.      Assessment & Plan:  Left hamstring pain: + superior hamstring weakness.  Patient given home hamstring exercises.  Also encouraged to stretch, do hop and overstride exercises on his cooldown from his runs. -can continue with Ibuprofen as needed for pain. -RTC for reevaluation in 6 weeks.

## 2013-05-16 NOTE — Assessment & Plan Note (Addendum)
New injury to left HS is subacute - 4 mos  HEP Compression Modify running form  Askling protocol Hopping and bounding exercise as a part of warm down  Cont sports insoles

## 2013-05-17 ENCOUNTER — Encounter: Payer: Self-pay | Admitting: Sports Medicine

## 2013-06-22 ENCOUNTER — Ambulatory Visit: Payer: Managed Care, Other (non HMO) | Admitting: Sports Medicine

## 2013-06-29 ENCOUNTER — Ambulatory Visit (INDEPENDENT_AMBULATORY_CARE_PROVIDER_SITE_OTHER): Payer: Managed Care, Other (non HMO) | Admitting: Sports Medicine

## 2013-06-29 VITALS — BP 130/83 | Ht 67.0 in | Wt 154.0 lb

## 2013-06-29 DIAGNOSIS — M79609 Pain in unspecified limb: Secondary | ICD-10-CM

## 2013-06-29 DIAGNOSIS — IMO0002 Reserved for concepts with insufficient information to code with codable children: Secondary | ICD-10-CM

## 2013-06-29 DIAGNOSIS — S86819A Strain of other muscle(s) and tendon(s) at lower leg level, unspecified leg, initial encounter: Secondary | ICD-10-CM

## 2013-06-29 DIAGNOSIS — S76319A Strain of muscle, fascia and tendon of the posterior muscle group at thigh level, unspecified thigh, initial encounter: Secondary | ICD-10-CM

## 2013-06-29 DIAGNOSIS — S838X9A Sprain of other specified parts of unspecified knee, initial encounter: Secondary | ICD-10-CM

## 2013-06-29 DIAGNOSIS — M79605 Pain in left leg: Secondary | ICD-10-CM

## 2013-06-29 MED ORDER — NITROGLYCERIN 0.2 MG/HR TD PT24: 0.2000 mg | MEDICATED_PATCH | Freq: Every day | TRANSDERMAL | Status: DC

## 2013-06-29 NOTE — Patient Instructions (Signed)

## 2013-06-29 NOTE — Progress Notes (Signed)
Patient ID: Roberto Martin, male   DOB: [birth year], 53 y.o.   MRN: [id number]  Patient is a runner with a chronic left HS strain Now for 6 wk follow up He had tried 4 mos of PT and massage but unable to get the HS to quit hurting with run  Since last visit doing modified Askling exrecises as well as some hop and bounding drills Able to run 20 MPW No real limp Still feels pain high in left HS if he tries to go too fast and sometimes if too slow  Improved > 70% since last visit  Exam NAD  + H test with 10 deg less HS extension on kick on left Moderate HS tightness bilat  HS strength is now good and almost equal No bruising or swelling  Running gait shows that he favors the left for 3 to 4 steps and then looks smooth with excellent running form  MSK US About 6 cm below ischial tuberosity in proximal biceps femoris hypoechoic area of 1 x 1 cm that looks like a small tear Mild fluid at insertion of HS tendons to ischial tuberosity Remainder of muscle looks normal

## 2013-06-29 NOTE — Assessment & Plan Note (Signed)
Note he has had a prior RT HS strain as well  This is improved Keep up HEP Maintain but don't increase training at this point Try NTG (but not when using viagra) protocol to see if we can speed resolution of tear This helped his calf in past HS compression  Reck 6 wks

## 2013-06-29 NOTE — Assessment & Plan Note (Signed)
About 70% better than 6 wks ago

## 2013-08-10 ENCOUNTER — Ambulatory Visit (INDEPENDENT_AMBULATORY_CARE_PROVIDER_SITE_OTHER): Payer: Managed Care, Other (non HMO) | Admitting: Sports Medicine

## 2013-08-10 ENCOUNTER — Encounter: Payer: Self-pay | Admitting: Sports Medicine

## 2013-08-10 VITALS — BP 130/80 | Ht 67.0 in | Wt 155.0 lb

## 2013-08-10 DIAGNOSIS — S838X9A Sprain of other specified parts of unspecified knee, initial encounter: Secondary | ICD-10-CM

## 2013-08-10 DIAGNOSIS — S86819A Strain of other muscle(s) and tendon(s) at lower leg level, unspecified leg, initial encounter: Principal | ICD-10-CM

## 2013-08-10 NOTE — Progress Notes (Signed)
Patient ID: Roberto ClaudeKenneth Martin, male   DOB: [birth year], 53 y.o.   MRN: [id number] 53 year old male presents for followup of left hamstring proximal tear. Overall he is doing better. He still has occasional episodes of pain. He has been doing a modified Askling protocol.  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 Minimal to no pain when not exercising currently.  Review of systems: As per history of present illness otherwise all systems negative  Examination: BP 130/80  Ht 5\' 7"  (1.702 m)  Wt 155 lb (70.308 kg)  BMI 24.27 kg/m2  well-developed well-nourished 53 year old white male awake alert oriented no acute distress Hamstrings: H. test equal bilaterally 5/5 strength bilaterally Extender-bilaterally tight hamstrings but equal building to extend  Gait: Patient has good running form is a midfoot  MSK ultrasound: Improved high hamstring tear with no evidence of defect in the region of the proximal biceps femoris at today's visit.

## 2013-08-10 NOTE — Assessment & Plan Note (Signed)
Overall doing much better. The defect is closed on ultrasound imaging. Patient deferred any nitroglycerin at last visit. At this time and do not feel this is necessary anymore. We added hopped drills into his pre-and post running routine. He will followup as needed in 2 months. He is asymptomatic is not 8 followup.

## 2013-09-28 ENCOUNTER — Ambulatory Visit: Payer: Managed Care, Other (non HMO) | Admitting: Sports Medicine

## 2014-06-26 ENCOUNTER — Encounter: Payer: Self-pay | Admitting: Sports Medicine

## 2014-06-26 ENCOUNTER — Ambulatory Visit (INDEPENDENT_AMBULATORY_CARE_PROVIDER_SITE_OTHER): Payer: Managed Care, Other (non HMO) | Admitting: Sports Medicine

## 2014-06-26 VITALS — BP 122/81 | HR 54 | Ht 67.0 in | Wt 154.0 lb

## 2014-06-26 DIAGNOSIS — M722 Plantar fascial fibromatosis: Secondary | ICD-10-CM

## 2014-06-26 NOTE — Assessment & Plan Note (Signed)
Currently he has right-sided symptoms of plantar fasciitis We have treated his left side in the past He uses sports insoles and uses heel lifts for chronic Achilles problems   arch strap Icing in a waterbath Stretches Home exercises Scaphoid pad added to his right sports insole  Okay to run but go to every other day Do not run past a pain level of 5  Recheck 2 months

## 2014-06-26 NOTE — Progress Notes (Signed)
Patient ID: Roberto Martin, male   DOB: 1960-12-27, 54 y.o.   MRN: [id number]  Patient enters complaining of right plantar fascial pain Some pain first step out of bed in the morning Pain seemed to increase after he completes runs Recently he has had pain within 2 miles of starting  He has seen a podiatrist who gave him a night splint He was doing a couple of arch exercises However the pain persists so he comes for evaluation  Physical examination No acute distress BP 122/81 mmHg  Pulse 54  Ht 5\' 7"  (1.702 m)  Wt 154 lb (69.854 kg)  BMI 24.11 kg/m2  Foot shape is fairly neutral No tenderness to palpation over the midportion of the plantar fascia near the os calcis No swelling over the foot Walking gait is neutral  Ultrasound There is only mild thickening in the proximal 2 cm of the plantar fascia back to the attachment of the calcaneus There is a small hypoechoic area and very small calcification or spurring noted at the mid calcaneus near the os calcis This corresponds to an area of tenderness on sono-palpation

## 2014-07-09 ENCOUNTER — Encounter: Payer: Self-pay | Admitting: Sports Medicine

## 2014-07-10 ENCOUNTER — Encounter: Payer: Self-pay | Admitting: Sports Medicine

## 2014-07-10 ENCOUNTER — Ambulatory Visit: Payer: Managed Care, Other (non HMO) | Admitting: Sports Medicine

## 2015-07-22 ENCOUNTER — Encounter: Payer: Self-pay | Admitting: Family Medicine

## 2015-07-22 ENCOUNTER — Ambulatory Visit (INDEPENDENT_AMBULATORY_CARE_PROVIDER_SITE_OTHER): Payer: Managed Care, Other (non HMO) | Admitting: Family Medicine

## 2015-07-22 VITALS — BP 153/82 | HR 57 | Temp 98.4°F | Resp 20 | Ht 67.0 in | Wt 158.2 lb

## 2015-07-22 DIAGNOSIS — M84376S Stress fracture, unspecified foot, sequela: Secondary | ICD-10-CM | POA: Diagnosis not present

## 2015-07-22 DIAGNOSIS — Z7689 Persons encountering health services in other specified circumstances: Secondary | ICD-10-CM

## 2015-07-22 DIAGNOSIS — R059 Cough, unspecified: Secondary | ICD-10-CM | POA: Insufficient documentation

## 2015-07-22 DIAGNOSIS — R05 Cough: Secondary | ICD-10-CM

## 2015-07-22 DIAGNOSIS — H6123 Impacted cerumen, bilateral: Secondary | ICD-10-CM

## 2015-07-22 DIAGNOSIS — M84376A Stress fracture, unspecified foot, initial encounter for fracture: Secondary | ICD-10-CM | POA: Insufficient documentation

## 2015-07-22 DIAGNOSIS — H6120 Impacted cerumen, unspecified ear: Secondary | ICD-10-CM | POA: Insufficient documentation

## 2015-07-22 MED ORDER — CARBAMIDE PEROXIDE 6.5 % OT SOLN: 5.0000 [drp] | Freq: Two times a day (BID) | OTIC | Status: DC

## 2015-07-22 NOTE — Patient Instructions (Addendum)
Health Maintenance, Male A healthy lifestyle and preventative care can promote health and wellness.  Maintain regular health, dental, and eye exams.  Eat a healthy diet. Foods like vegetables, fruits, whole grains, low-fat dairy products, and lean protein foods contain the nutrients you need and are low in calories. Decrease your intake of foods high in solid fats, added sugars, and salt. Get information about a proper diet from your health care provider, if necessary.  Regular physical exercise is one of the most important things you can do for your health. Most adults should get at least 150 minutes of moderate-intensity exercise (any activity that increases your heart rate and causes you to sweat) each week. In addition, most adults need muscle-strengthening exercises on 2 or more days a week.   Maintain a healthy weight. The body mass index (BMI) is a screening tool to identify possible weight problems. It provides an estimate of body fat based on height and weight. Your health care provider can find your BMI and can help you achieve or maintain a healthy weight. For males 20 years and older:  A BMI below 18.5 is considered underweight.  A BMI of 18.5 to 24.9 is normal.  A BMI of 25 to 29.9 is considered overweight.  A BMI of 30 and above is considered obese.  Maintain normal blood lipids and cholesterol by exercising and minimizing your intake of saturated fat. Eat a balanced diet with plenty of fruits and vegetables. Blood tests for lipids and cholesterol should begin at age 20 and be repeated every 5 years. If your lipid or cholesterol levels are high, you are over age 50, or you are at high risk for heart disease, you may need your cholesterol levels checked more frequently.Ongoing high lipid and cholesterol levels should be treated with medicines if diet and exercise are not working.  If you smoke, find out from your health care provider how to quit. If you do not use tobacco, do not  start.  Lung cancer screening is recommended for adults aged 55-80 years who are at high risk for developing lung cancer because of a history of smoking. A yearly low-dose CT scan of the lungs is recommended for people who have at least a 30-pack-year history of smoking and are current smokers or have quit within the past 15 years. A pack year of smoking is smoking an average of 1 pack of cigarettes a day for 1 year (for example, a 30-pack-year history of smoking could mean smoking 1 pack a day for 30 years or 2 packs a day for 15 years). Yearly screening should continue until the smoker has stopped smoking for at least 15 years. Yearly screening should be stopped for people who develop a health problem that would prevent them from having lung cancer treatment.  If you choose to drink alcohol, do not have more than 2 drinks per day. One drink is considered to be 12 oz (360 mL) of beer, 5 oz (150 mL) of wine, or 1.5 oz (45 mL) of liquor.  Avoid the use of street drugs. Do not share needles with anyone. Ask for help if you need support or instructions about stopping the use of drugs.  High blood pressure causes heart disease and increases the risk of stroke. High blood pressure is more likely to develop in:  People who have blood pressure in the end of the normal range (100-139/85-89 mm Hg).  People who are overweight or obese.  People who are African American.    If you are 18-39 years of age, have your blood pressure checked every 3-5 years. If you are 40 years of age or older, have your blood pressure checked every year. You should have your blood pressure measured twice--once when you are at a hospital or clinic, and once when you are not at a hospital or clinic. Record the average of the two measurements. To check your blood pressure when you are not at a hospital or clinic, you can use:  An automated blood pressure machine at a pharmacy.  A home blood pressure monitor.  If you are 45-79 years  old, ask your health care provider if you should take aspirin to prevent heart disease.  Diabetes screening involves taking a blood sample to check your fasting blood sugar level. This should be done once every 3 years after age 45 if you are at a normal weight and without risk factors for diabetes. Testing should be considered at a younger age or be carried out more frequently if you are overweight and have at least 1 risk factor for diabetes.  Colorectal cancer can be detected and often prevented. Most routine colorectal cancer screening begins at the age of 50 and continues through age 75. However, your health care provider may recommend screening at an earlier age if you have risk factors for colon cancer. On a yearly basis, your health care provider may provide home test kits to check for hidden blood in the stool. A small camera at the end of a tube may be used to directly examine the colon (sigmoidoscopy or colonoscopy) to detect the earliest forms of colorectal cancer. Talk to your health care provider about this at age 50 when routine screening begins. A direct exam of the colon should be repeated every 5-10 years through age 75, unless early forms of precancerous polyps or small growths are found.  People who are at an increased risk for hepatitis B should be screened for this virus. You are considered at high risk for hepatitis B if:  You were born in a country where hepatitis B occurs often. Talk with your health care provider about which countries are considered high risk.  Your parents were born in a high-risk country and you have not received a shot to protect against hepatitis B (hepatitis B vaccine).  You have HIV or AIDS.  You use needles to inject street drugs.  You live with, or have sex with, someone who has hepatitis B.  You are a man who has sex with other men (MSM).  You get hemodialysis treatment.  You take certain medicines for conditions like cancer, organ  transplantation, and autoimmune conditions.  Hepatitis C blood testing is recommended for all people born from 1945 through 1965 and any individual with known risk factors for hepatitis C.  Healthy men should no longer receive prostate-specific antigen (PSA) blood tests as part of routine cancer screening. Talk to your health care provider about prostate cancer screening.  Testicular cancer screening is not recommended for adolescents or adult males who have no symptoms. Screening includes self-exam, a health care provider exam, and other screening tests. Consult with your health care provider about any symptoms you have or any concerns you have about testicular cancer.  Practice safe sex. Use condoms and avoid high-risk sexual practices to reduce the spread of sexually transmitted infections (STIs).  You should be screened for STIs, including gonorrhea and chlamydia if:  You are sexually active and are younger than 24 years.  You   are older than 24 years, and your health care provider tells you that you are at risk for this type of infection.  Your sexual activity has changed since you were last screened, and you are at an increased risk for chlamydia or gonorrhea. Ask your health care provider if you are at risk.  If you are at risk of being infected with HIV, it is recommended that you take a prescription medicine daily to prevent HIV infection. This is called pre-exposure prophylaxis (PrEP). You are considered at risk if:  You are a man who has sex with other men (MSM).  You are a heterosexual man who is sexually active with multiple partners.  You take drugs by injection.  You are sexually active with a partner who has HIV.  Talk with your health care provider about whether you are at high risk of being infected with HIV. If you choose to begin PrEP, you should first be tested for HIV. You should then be tested every 3 months for as long as you are taking PrEP.  Use sunscreen. Apply  sunscreen liberally and repeatedly throughout the day. You should seek shade when your shadow is shorter than you. Protect yourself by wearing long sleeves, pants, a wide-brimmed hat, and sunglasses year round whenever you are outdoors.  Tell your health care provider of new moles or changes in moles, especially if there is a change in shape or color. Also, tell your health care provider if a mole is larger than the size of a pencil eraser.  A one-time screening for abdominal aortic aneurysm (AAA) and surgical repair of large AAAs by ultrasound is recommended for men aged 65-75 years who are current or former smokers.  Stay current with your vaccines (immunizations).   This information is not intended to replace advice given to you by your health care provider. Make sure you discuss any questions you have with your health care provider.   Document Released: 10/03/2007 Document Revised: 04/27/2014 Document Reviewed: 09/01/2010 Elsevier Interactive Patient Education 2016 Elsevier Inc.  Gastroesophageal Reflux Disease, Adult Normally, food travels down the esophagus and stays in the stomach to be digested. However, when a person has gastroesophageal reflux disease (GERD), food and stomach acid move back up into the esophagus. When this happens, the esophagus becomes sore and inflamed. Over time, GERD can create small holes (ulcers) in the lining of the esophagus.  CAUSES This condition is caused by a problem with the muscle between the esophagus and the stomach (lower esophageal sphincter, or LES). Normally, the LES muscle closes after food passes through the esophagus to the stomach. When the LES is weakened or abnormal, it does not close properly, and that allows food and stomach acid to go back up into the esophagus. The LES can be weakened by certain dietary substances, medicines, and medical conditions, including:  Tobacco use.  Pregnancy.  Having a hiatal hernia.  Heavy alcohol  use.  Certain foods and beverages, such as coffee, chocolate, onions, and peppermint. RISK FACTORS This condition is more likely to develop in:  People who have an increased body weight.  People who have connective tissue disorders.  People who use NSAID medicines. SYMPTOMS Symptoms of this condition include:  Heartburn.  Difficult or painful swallowing.  The feeling of having a lump in the throat.  Abitter taste in the mouth.  Bad breath.  Having a large amount of saliva.  Having an upset or bloated stomach.  Belching.  Chest pain.  Shortness of breath  or wheezing.  Ongoing (chronic) cough or a night-time cough.  Wearing away of tooth enamel.  Weight loss. Different conditions can cause chest pain. Make sure to see your health care provider if you experience chest pain. DIAGNOSIS Your health care provider will take a medical history and perform a physical exam. To determine if you have mild or severe GERD, your health care provider may also monitor how you respond to treatment. You may also have other tests, including:  An endoscopy toexamine your stomach and esophagus with a small camera.  A test thatmeasures the acidity level in your esophagus.  A test thatmeasures how much pressure is on your esophagus.  A barium swallow or modified barium swallow to show the shape, size, and functioning of your esophagus. TREATMENT The goal of treatment is to help relieve your symptoms and to prevent complications. Treatment for this condition may vary depending on how severe your symptoms are. Your health care provider may recommend:  Changes to your diet.  Medicine.  Surgery. HOME CARE INSTRUCTIONS Diet  Follow a diet as recommended by your health care provider. This may involve avoiding foods and drinks such as:  Coffee and tea (with or without caffeine).  Drinks that containalcohol.  Energy drinks and sports drinks.  Carbonated drinks or  sodas.  Chocolate and cocoa.  Peppermint and mint flavorings.  Garlic and onions.  Horseradish.  Spicy and acidic foods, including peppers, chili powder, curry powder, vinegar, hot sauces, and barbecue sauce.  Citrus fruit juices and citrus fruits, such as oranges, lemons, and limes.  Tomato-based foods, such as red sauce, chili, salsa, and pizza with red sauce.  Fried and fatty foods, such as donuts, french fries, potato chips, and high-fat dressings.  High-fat meats, such as hot dogs and fatty cuts of red and white meats, such as rib eye steak, sausage, ham, and bacon.  High-fat dairy items, such as whole milk, butter, and cream cheese.  Eat small, frequent meals instead of large meals.  Avoid drinking large amounts of liquid with your meals.  Avoid eating meals during the 2-3 hours before bedtime.  Avoid lying down right after you eat.  Do not exercise right after you eat. General Instructions  Pay attention to any changes in your symptoms.  Take over-the-counter and prescription medicines only as told by your health care provider. Do not take aspirin, ibuprofen, or other NSAIDs unless your health care provider told you to do so.  Do not use any tobacco products, including cigarettes, chewing tobacco, and e-cigarettes. If you need help quitting, ask your health care provider.  Wear loose-fitting clothing. Do not wear anything tight around your waist that causes pressure on your abdomen.  Raise (elevate) the head of your bed 6 inches (15cm).  Try to reduce your stress, such as with yoga or meditation. If you need help reducing stress, ask your health care provider.  If you are overweight, reduce your weight to an amount that is healthy for you. Ask your health care provider for guidance about a safe weight loss goal.  Keep all follow-up visits as told by your health care provider. This is important. SEEK MEDICAL CARE IF:  You have new symptoms.  You have  unexplained weight loss.  You have difficulty swallowing, or it hurts to swallow.  You have wheezing or a persistent cough.  Your symptoms do not improve with treatment.  You have a hoarse voice. SEEK IMMEDIATE MEDICAL CARE IF:  You have pain in your arms,  neck, jaw, teeth, or back.  You feel sweaty, dizzy, or light-headed.  You have chest pain or shortness of breath.  You vomit and your vomit looks like blood or coffee grounds.  You faint.  Your stool is bloody or black.  You cannot swallow, drink, or eat.   This information is not intended to replace advice given to you by your health care provider. Make sure you discuss any questions you have with your health care provider.   Document Released: 01/14/2005 Document Revised: 12/26/2014 Document Reviewed: 08/01/2014 Elsevier Interactive Patient Education [employer]2016 Elsevier Inc.

## 2015-07-22 NOTE — Progress Notes (Signed)
Patient ID: Roberto Martin, male   DOB: 1961-01-22, 55 y.o.   MRN: [id number]      Patient ID: Roberto Martin, male  DOB: 1961-01-22, 55 y.o.   MRN: [id number]  Subjective:  Roberto Martin is a 55 y.o. male present for establish care All past medical history, surgical history, allergies, family history, immunizations, medications and social history were obtained and updated in the electronic medical record today. All recent labs, ED visits and hospitalizations within the last year were reviewed.  Raspy voice: Patient states he is always felt the need to clear his throat and have a small tickle-like sensation for many years. He has noticed over the last 2 months it because become more intense. He feels that his voices cracking and he has a raspiness to his voice. He feels that it all started a few months ago after he had an upper respiratory infection. He has had GERD-like symptoms many years ago with heartburn. He denies any current heartburn or GERD-like symptoms. He took Prilosec at that time and symptoms resolved within a few weeks. He has never smoked. He rarely drinks alcohol. He denies hemoptysis or productive cough. She denies fever, chills, nausea or vomiting.  Frequent cerumen impaction: Patient was evaluated by [facility] for his "hearing loss "and was told that is hearing is normal. He does have recurrent cerumen impaction, needing frequent lavage. He states after his ear wax is removed his hearing is normal.  Stress fracture foot: Patient has a recent stress fracture for his foot he is in a [employer] for 3 weeks. He is taking a daily multivitamin with 200 units of vitamin D. He runs daily. He does not recall injury to the area. He states he woke up one morning and his foot was hurting. He has been receiving cortisone injections for his plantar fasciitis  Health maintenance:  Colonoscopy: colon polyps (hyperplastic) 2011. 10 year follow up. Immunizations: UTD flu and tdap Infectious  disease screening: Unknown HIV and Hep c screen PSA: 4.1, urologist in [place], follows yearly.  Assistive device: None  Oxygen use: none Patient has a Dental home. Hospitalizations/ED visits:  none   Past Medical History  Diagnosis Date  . Colon polyps     benign  . Plantar fasciitis   . Heart murmur   . Lateral epicondylitis    Allergies  Allergen Reactions  . Shellfish Allergy Nausea And Vomiting     paralyzed and head aches    Past Surgical History  Procedure Laterality Date  . Tonsillectomy    . Testicular biopsy     Family History  Problem Relation Age of Onset  . Heart attack Father   . Hyperlipidemia Neg Hx   . Hypertension Neg Hx   . Sudden death Neg Hx   . Diabetes Neg Hx   . COPD Mother   . COPD Sister   . Cancer Sister   . Mental illness Brother    Social History   Social History  . Marital Status: Married    Spouse Name: N/A  . Number of Children: N/A  . Years of Education: N/A   Occupational History  . Not on file.   Social History Main Topics  . Smoking status: Never Smoker   . Smokeless tobacco: Not on file  . Alcohol Use: Yes     Comment: occasional  . Drug Use: No  . Sexual Activity: Yes    Birth Control/ Protection: None   Other Topics Concern  . Not on file  Social History Narrative   Married to Roberto Martin. No children.   MBA degree, [employer]    No tobacco, drug use. Drinks a glass of wine rarely on special occasions.   Patient does not know father's family history.   Caffeinated beverages. Daily vitamin.   Wears his seat belt and bicycle helmet.   Exercises at least 3 times a week. Patient runs daily.   Smoke detector in the home.   Physical safe in his relationships.    ROS: Negative, with the exception of above mentioned in HPI  Objective: BP 153/82 mmHg  Pulse 57  Temp(Src) 98.4 F (36.9 C)  Resp 20  Ht  (1.702 m)  Wt 158 lb 4 oz (71.782 kg)  BMI 24.78 kg/m2  SpO2 98% Gen: Afebrile. No acute distress.  Nontoxic in appearance, well-developed, well-nourished, male, Pleasant Caucasian male. HENT: AT. [place]. Bilateral TM Unable to be visualized secondary to bilateral cerumen impaction. MMM, no oral lesions, good dentition. Bilateral nares with erythema and swelling. Throat without erythema, ulcerations or exudates. No Cough on exam, no hoarseness on exam. Eyes:Pupils Equal Round Reactive to light, Extraocular movements intact,  Conjunctiva without redness, discharge or icterus. Neck/lymp/endocrine: Supple, no lymphadenopathy, no thyromegaly CV: RRR no murmur appreciated, no edema Chest: CTAB, no wheeze, rhonchi or crackles. Normal Respiratory effort. Good Air movement. Abd: Soft. Flat. NTND. BS present. No Masses palpated. No hepatosplenomegaly. No rebound tenderness or guarding. Skin: No rashes, purpura or petechiae. Warm and well-perfused. Skin intact. Neuro/Msk:  Normal gait. PERLA. EOMi. Alert. Oriented x3.   Psych: Normal affect, dress and demeanor. Normal speech. Normal thought content and judgment.   Assessment/plan: Roberto Martin is a 55 y.o. male present for establishment of care with compliant of chronic cough.  1. Stress fracture of foot, unspecified laterality, sequela - Under the care of sports medicine.  - continue CAM boot and follow as directed.  - Encouraged him to supplement with 800 u of Vitamin D daily.   2. Cough - PMH of GERD. Discussed chronic cough can have multiple causes. He is a nonsmoker, and does not drink etoh. Possibly secondary to allergies vs GERD.  - He is to take antihistamine of choice daily with Flonase.  - He is to also add PPI to regimen (OTC) for 4 weeks. If cough is not resolved with the above regimen and GERD diet (provided to pt today) he is to follow up and we would consider GI vs ENT referral.   3. Health Maintenance: Pt has had recent lab at [facility] through a work benefits program. He will have copies sent to [place]. All health maintenance appears UTD, with  the exception of unknown infectious disease screens. Will offer ID screening at next CPR if not completed.   4. Bilateral cerumen impaction: Patient was encouraged to use debrox BID for 3-4 days and then every other week. - Debrox prescribed today  6 weeks F/U if cough persists, sooner if worsens.   Return in about 6 weeks (around 09/02/2015), or cough. Greater than 45 minutes was spent with patient, greater than 50% of that time was spent face-to-face with patient counseling and coordinating care.  Electronically signed by: Felix Pacini, DO [facility] [place]

## 2015-12-20 ENCOUNTER — Encounter: Payer: Self-pay | Admitting: Family Medicine

## 2015-12-20 ENCOUNTER — Ambulatory Visit (INDEPENDENT_AMBULATORY_CARE_PROVIDER_SITE_OTHER): Payer: Managed Care, Other (non HMO) | Admitting: Family Medicine

## 2015-12-20 VITALS — BP 129/81 | HR 61 | Temp 97.9°F | Resp 20 | Wt 160.8 lb

## 2015-12-20 DIAGNOSIS — L089 Local infection of the skin and subcutaneous tissue, unspecified: Secondary | ICD-10-CM

## 2015-12-20 MED ORDER — DOXYCYCLINE HYCLATE 100 MG PO TABS: 100.0000 mg | ORAL_TABLET | Freq: Two times a day (BID) | ORAL | 0 refills | Status: DC

## 2015-12-20 NOTE — Progress Notes (Signed)
Roberto Martin , [birth year], 55 y.o., male MRN: [id number] Patient Care Team    Relationship Specialty Notifications Start End  Natalia Leatherwood, DO PCP - General Family Medicine  07/22/15     CC: Finger pain Subjective: She presents for an acute office visit with right middle finger pain. Patient states approximately 2 weeks ago he sustained a paper cut to this area. At the time of injury he washed the area well. He states it did bleed quite a bit at that time. He thought that it was getting much better until Monday. On Monday he accidentally hit that area and since then it has become red, swollen and has a blisterlike formation. Patient denies fevers, chills or numbness to the finger.  Allergies  Allergen Reactions  . Shellfish Allergy Nausea And Vomiting     paralyzed and head aches    Social History  Substance Use Topics  . Smoking status: Never Smoker  . Smokeless tobacco: Not on file  . Alcohol use Yes     Comment: occasional   Past Medical History:  Diagnosis Date  . Colon polyps    benign  . Heart murmur   . Lateral epicondylitis   . Plantar fasciitis    Past Surgical History:  Procedure Laterality Date  . testicular biopsy    . TONSILLECTOMY     Family History  Problem Relation Age of Onset  . Heart attack Father   . Hyperlipidemia Neg Hx   . Hypertension Neg Hx   . Sudden death Neg Hx   . Diabetes Neg Hx   . COPD Mother   . COPD Sister   . Cancer Sister   . Leukemia Sister   . Mental illness Brother      Medication List       Accurate as of 12/20/15 12:48 PM. Always use your most recent med list.          carbamide peroxide 6.5 % otic solution Commonly known as:  DEBROX Place 5 drops into both ears 2 (two) times daily.   doxycycline 100 MG tablet Commonly known as:  VIBRA-TABS Take 1 tablet (100 mg total) by mouth 2 (two) times daily.   fluticasone 50 MCG/ACT nasal spray Commonly known as:  FLONASE Place into both nostrils daily.     ibuprofen 200 MG tablet Commonly known as:  ADVIL,MOTRIN Take by mouth.       No results found for this or any previous visit (from the past 24 hour(s)). No results found.   ROS: Negative, with the exception of above mentioned in HPI   Objective:  BP 129/81   Pulse 61   Temp 97.9 F (36.6 C)   Resp 20   Wt 160 lb 12 oz (72.9 kg)   BMI 25.18 kg/m  Body mass index is 25.18 kg/m. Gen: Afebrile. No acute distress. Nontoxic in appearance, well developed, well nourished.  HENT: AT. [place].MMM Eyes:Pupils Equal Round Reactive to light, Extraocular movements intact,  Conjunctiva without redness, discharge or icterus. Skin/right middle finger: Mild erythema any all proximal aspect of right middle finger, very mild swelling, mild tender palpation, approximately 2 cm blisterlike formation with green/pus fluid within. Small fluctuance. Neurovascularly intact distally. Neuro: Normal gait. PERLA. EOMi. Alert. Oriented x3  Assessment/Plan: Roberto Martin is a 55 y.o. male present for acute OV for  Finger infection - Small incision and drainage performed today. Area was cleansed with Betadine and alcohol solution, sterile 18-gauge syringe plans. Of concern, with a  small amount of green thick fluid expelled. Antibiotic ointment and Band-Aid was applied to the area. Patient tolerated procedure well. Patient was educated on proper cleansing and bandage treatment. Will treat with oral antibiotics consisting close to the fingernail. - doxycycline (VIBRA-TABS) 100 MG tablet; Take 1 tablet (100 mg total) by mouth 2 (two) times daily.  Dispense: 14 tablet; Refill: 0 - Follow-up in one week if not resolved. Sooner if worsening.   > 25 minutes spent with patient, >50% of time spent face to face counseling patient and coordinating care.  electronically signed by:  Felix Pacinienee Carl Bleecker, DO  [facility] [place]

## 2016-08-14 ENCOUNTER — Encounter: Payer: Self-pay | Admitting: Family Medicine

## 2016-08-14 ENCOUNTER — Ambulatory Visit (INDEPENDENT_AMBULATORY_CARE_PROVIDER_SITE_OTHER): Payer: Managed Care, Other (non HMO) | Admitting: Family Medicine

## 2016-08-14 VITALS — BP 127/88 | HR 49 | Temp 97.6°F | Resp 16 | Wt 160.5 lb

## 2016-08-14 DIAGNOSIS — S161XXA Strain of muscle, fascia and tendon at neck level, initial encounter: Secondary | ICD-10-CM

## 2016-08-14 MED ORDER — CYCLOBENZAPRINE HCL 10 MG PO TABS: 10.0000 mg | ORAL_TABLET | Freq: Three times a day (TID) | ORAL | 0 refills | Status: DC | PRN

## 2016-08-14 NOTE — Progress Notes (Signed)
OFFICE VISIT  08/14/2016   CC:  Chief Complaint  Patient presents with  . Neck Pain    left side neck spasms per patient x 1 week   HPI:    Patient is a 56 y.o.  male who presents for neck complaint. Occ gets LB and neck muscle spasms and they resolve with ibup and rest in a few days. However, about 9 days ago he had onset of left sided neck pain when he turns to the left or right, extends over top of L trapezius.  Started on R side and went away, then moved to L--L side not resolving as it usually has in the past. He recalls reaching up with R hand and felt neck tighten right before neck pain began.  No trauma. No radiation of pain down arms, no paresthesias.  No arm weakness. He is able to continue running for exercise and it doesn't bother him. Has no recollection of any muscle relaxer ever rx'd in the past. Not taking ibup regularly. Heating pad applied x 1d--helped.  Past Medical History:  Diagnosis Date  . Colon polyps    benign  . Heart murmur   . Lateral epicondylitis   . Plantar fasciitis     Past Surgical History:  Procedure Laterality Date  . testicular biopsy    . TONSILLECTOMY      Outpatient Medications Prior to Visit  Medication Sig Dispense Refill  . ibuprofen (ADVIL,MOTRIN) 200 MG tablet Take by mouth.    . carbamide peroxide (DEBROX) 6.5 % otic solution Place 5 drops into both ears 2 (two) times daily. (Patient not taking: Reported on 08/14/2016) 15 mL 0  . fluticasone (FLONASE) 50 MCG/ACT nasal spray Place into both nostrils daily.    Marland Kitchen doxycycline (VIBRA-TABS) 100 MG tablet Take 1 tablet (100 mg total) by mouth 2 (two) times daily. (Patient not taking: Reported on 08/14/2016) 14 tablet 0   No facility-administered medications prior to visit.     Allergies  Allergen Reactions  . Shellfish Allergy Nausea And Vomiting     paralyzed and head aches     ROS As per HPI  PE: Blood pressure 127/88, pulse (!) 49, temperature 97.6 F (36.4 C),  temperature source Oral, resp. rate 16, weight 160 lb 8 oz (72.8 kg), SpO2 100 %. Gen: Alert, well appearing.  Patient is oriented to person, place, time, and situation. AFFECT: pleasant, lucid thought and speech. Neck: no tenderness.  Pain in L paraspinous soft tissues diffusely with all ROM of neck, esp rotation. ROM limited about 25% in all normal ROM of C spine. Spurling's neg bilat.  UE strength 5/5 prox/dist bilat.  LABS:  none  IMPRESSION AND PLAN:  Neck strain, left side. Flexeril  tid prn rx'd.  Therapeutic expectations and side effect profile of medication discussed today.  Patient's questions answered.  Instructions: Take 600 mg ibuprofen twice a day with food for 10 days. Apply heating pad liberally. Massage would be helpful. Do gentle range of motion with neck at least 3 times a day.  If not significantly improved in 10d, call or return.  PT is next step.  FOLLOW UP: Return if symptoms worsen or fail to improve.  Signed:  Santiago Bumpers, MD           08/14/2016

## 2016-08-14 NOTE — Patient Instructions (Signed)
Take 600 mg ibuprofen twice a day with food for 10 days. Apply heating pad liberally. Massage would be helpful. Do gentle range of motion with neck at least 3 times a day.  If not significantly improved in 10d, call or return.  PT is next step.

## 2016-12-09 ENCOUNTER — Encounter: Payer: Self-pay | Admitting: Family Medicine

## 2016-12-09 DIAGNOSIS — R972 Elevated prostate specific antigen [PSA]: Secondary | ICD-10-CM | POA: Insufficient documentation

## 2016-12-25 ENCOUNTER — Encounter: Payer: Self-pay | Admitting: Sports Medicine

## 2016-12-25 ENCOUNTER — Ambulatory Visit (INDEPENDENT_AMBULATORY_CARE_PROVIDER_SITE_OTHER): Payer: Managed Care, Other (non HMO) | Admitting: Sports Medicine

## 2016-12-25 ENCOUNTER — Ambulatory Visit (INDEPENDENT_AMBULATORY_CARE_PROVIDER_SITE_OTHER): Payer: Managed Care, Other (non HMO)

## 2016-12-25 DIAGNOSIS — M546 Pain in thoracic spine: Secondary | ICD-10-CM

## 2016-12-25 DIAGNOSIS — M7712 Lateral epicondylitis, left elbow: Secondary | ICD-10-CM

## 2016-12-25 DIAGNOSIS — M549 Dorsalgia, unspecified: Secondary | ICD-10-CM

## 2016-12-25 DIAGNOSIS — M5031 Other cervical disc degeneration,  high cervical region: Secondary | ICD-10-CM | POA: Diagnosis not present

## 2016-12-25 DIAGNOSIS — G8929 Other chronic pain: Secondary | ICD-10-CM

## 2016-12-25 DIAGNOSIS — M5416 Radiculopathy, lumbar region: Secondary | ICD-10-CM | POA: Insufficient documentation

## 2016-12-25 MED ORDER — MELOXICAM 15 MG PO TABS: ORAL_TABLET | ORAL | 3 refills | Status: DC

## 2016-12-25 NOTE — Assessment & Plan Note (Signed)
Meloxicam will help. Rehabilitation exercises given Injection if no better

## 2016-12-25 NOTE — Progress Notes (Signed)
   Subjective:    I'm seeing this patient as a consultation for:  Dr. Darryll Capersene Kuneff  CC: Low back pain, elbow pain  HPI: This is a pleasant 56 year old male, healthy, he is a runner. For several months he's had pain that he localizes in his mid thoracic spine, worse with sitting, flexion. Often worse in the morning, with stiffness. Moderate, persistent without radiation, no bowel or bladder dysfunction, saddle numbness, constitutional symptoms, no trauma. He occasionally takes ibuprofen which is effective.  He also has pain that he localizes over the lateral aspect of his left elbow, severe, persistent, localized without radiation.  Past medical history, Surgical history, Family history not pertinant except as noted below, Social history, Allergies, and medications have been entered into the medical record, reviewed, and no changes needed.   Review of Systems: No headache, visual changes, nausea, vomiting, diarrhea, constipation, dizziness, abdominal pain, skin rash, fevers, chills, night sweats, weight loss, swollen lymph nodes, body aches, joint swelling, muscle aches, chest pain, shortness of breath, mood changes, visual or auditory hallucinations.   Objective:   General: Well Developed, well nourished, and in no acute distress.  Neuro:  Extra-ocular muscles intact, able to move all 4 extremities, sensation grossly intact.  Deep tendon reflexes tested were normal. Psych: Alert and oriented, mood congruent with affect. ENT:  Ears and nose appear unremarkable.  Hearing grossly normal. Neck: Unremarkable overall appearance, trachea midline.  No visible thyroid enlargement. Eyes: Conjunctivae and lids appear unremarkable.  Pupils equal and round. Skin: Warm and dry, no rashes noted.  Cardiovascular: Pulses palpable, no extremity edema. Back Exam:  Inspection: Unremarkable  Motion: Flexion 45 deg, Extension 45 deg, Side Bending to 45 deg bilaterally,  Rotation to 45 deg bilaterally  SLR  laying: Negative  XSLR laying: Negative  Palpable tenderness: None. FABER: negative. Sensory change: Gross sensation intact to all lumbar and sacral dermatomes.  Reflexes: 2+ at both patellar tendons, 2+ at achilles tendons, Babinski's downgoing.  Strength at foot  Plantar-flexion: 5/5 Dorsi-flexion: 5/5 Eversion: 5/5 Inversion: 5/5  Leg strength  Quad: 5/5 Hamstring: 5/5 Hip flexor: 5/5 Hip abductors: 5/5  Gait unremarkable. Left Elbow: Unremarkable to inspection. Range of motion full pronation, supination, flexion, extension. Strength is full to all of the above directions Stable to varus, valgus stress. Negative moving valgus stress test. Exquisite tender to palpation over the common extensor tendon origin at the lateral epicondyle with reproduction of pain with resisted extension of the middle finger Ulnar nerve does not sublux. Negative cubital tunnel Tinel's.  Impression and Recommendations:   This case required medical decision making of moderate complexity.  Back pain Pain is in the midline of the thoracic spine, approximately T6-T9. Exam is benign. X-rays of the cervical, thoracic and lumbar spine, meloxicam, formal physical therapy. I did explain the natural history of lumbar degenerative disc disease.  Return to see me in 6 weeks, MR for interventional planning if no better.  Lateral epicondylitis, left elbow Meloxicam will help. Rehabilitation exercises given Injection if no better  ___________________________________________ Ihor Austinhomas J. Benjamin Stainhekkekandam, M.D., ABFM., CAQSM. Primary Care and Sports Medicine [facility]  Adjunct Instructor of Family Medicine  [employer]

## 2016-12-25 NOTE — Assessment & Plan Note (Signed)
Pain is in the midline of the thoracic spine, approximately T6-T9. Exam is benign. X-rays of the cervical, thoracic and lumbar spine, meloxicam, formal physical therapy. I did explain the natural history of lumbar degenerative disc disease.  Return to see me in 6 weeks, MR for interventional planning if no better.

## 2017-02-09 ENCOUNTER — Ambulatory Visit: Payer: Managed Care, Other (non HMO) | Admitting: Sports Medicine

## 2017-02-09 ENCOUNTER — Telehealth: Payer: Self-pay | Admitting: Sports Medicine

## 2017-02-09 NOTE — Telephone Encounter (Signed)
Noted  

## 2017-02-09 NOTE — Telephone Encounter (Signed)
Dr. Karie Schwalbe Pt called @ 8:05 am advised that he has a bad cold and going to [facility] not feeling well... Cancelled appt after 8:30 left message voicemail.. Thanks

## 2018-02-08 ENCOUNTER — Encounter: Payer: Self-pay | Admitting: Sports Medicine

## 2018-02-08 ENCOUNTER — Ambulatory Visit (INDEPENDENT_AMBULATORY_CARE_PROVIDER_SITE_OTHER): Payer: Managed Care, Other (non HMO) | Admitting: Sports Medicine

## 2018-02-08 ENCOUNTER — Ambulatory Visit (INDEPENDENT_AMBULATORY_CARE_PROVIDER_SITE_OTHER): Payer: 59

## 2018-02-08 DIAGNOSIS — M7542 Impingement syndrome of left shoulder: Secondary | ICD-10-CM

## 2018-02-08 DIAGNOSIS — M25512 Pain in left shoulder: Secondary | ICD-10-CM

## 2018-02-08 DIAGNOSIS — M25511 Pain in right shoulder: Secondary | ICD-10-CM | POA: Diagnosis not present

## 2018-02-08 MED ORDER — MELOXICAM 15 MG PO TABS: ORAL_TABLET | ORAL | 3 refills | Status: DC

## 2018-02-08 NOTE — Progress Notes (Signed)
Subjective:    I'm seeing this patient as a consultation for: Dr. Felix Pacini  CC: Left shoulder pain  HPI: This is a pleasant 57 year old male, for the past several months he is noted increasing pain in his left shoulder, lesser so on the right.  Localized anteriorly, worse with overhead activities.  Recently had a [employer], try to throw football and only was able to throat a couple of yards.  Symptoms are moderate, persistent, localized without radiation.  No trauma, no mechanical symptoms.  I reviewed the past medical history, family history, social history, surgical history, and allergies today and no changes were needed.  Please see the problem list section below in epic for further details.  Past Medical History: Past Medical History:  Diagnosis Date  . Colon polyps    benign  . Heart murmur   . Lateral epicondylitis   . Plantar fasciitis    Past Surgical History: Past Surgical History:  Procedure Laterality Date  . testicular biopsy    . TONSILLECTOMY     Social History: Social History   Socioeconomic History  . Marital status: Married    Spouse name: Not on file  . Number of children: Not on file  . Years of education: Not on file  . Highest education level: Not on file  Occupational History  . Not on file  Social Needs  . Financial resource strain: Not on file  . Food insecurity:    Worry: Not on file    Inability: Not on file  . Transportation needs:    Medical: Not on file    Non-medical: Not on file  Tobacco Use  . Smoking status: Never Smoker  . Smokeless tobacco: Never Used  Substance and Sexual Activity  . Alcohol use: Yes    Comment: occasional  . Drug use: No  . Sexual activity: Yes    Birth control/protection: None  Lifestyle  . Physical activity:    Days per week: Not on file    Minutes per session: Not on file  . Stress: Not on file  Relationships  . Social connections:    Talks on phone: Not on file    Gets  together: Not on file    Attends religious service: Not on file    Active member of club or organization: Not on file    Attends meetings of clubs or organizations: Not on file    Relationship status: Not on file  Other Topics Concern  . Not on file  Social History Narrative   Married to [place]. No children.   MBA degree, [employer]    No tobacco, drug use. Drinks a glass of wine rarely on special occasions.   Patient does not know father's family history.   Caffeinated beverages. Daily vitamin.   Wears his seat belt and bicycle helmet.   Exercises at least 3 times a week. Patient runs daily.   Smoke detector in the home.   Physical safe in his relationships.   Family History: Family History  Problem Relation Age of Onset  . Heart attack Father   . COPD Mother   . COPD Sister   . Cancer Sister   . Leukemia Sister   . Mental illness Brother   . Hyperlipidemia Neg Hx   . Hypertension Neg Hx   . Sudden death Neg Hx   . Diabetes Neg Hx    Allergies: Allergies  Allergen Reactions  . Shellfish Allergy Nausea And Vomiting  paralyzed and head aches    Medications: See med rec.  Review of Systems: No headache, visual changes, nausea, vomiting, diarrhea, constipation, dizziness, abdominal pain, skin rash, fevers, chills, night sweats, weight loss, swollen lymph nodes, body aches, joint swelling, muscle aches, chest pain, shortness of breath, mood changes, visual or auditory hallucinations.   Objective:   General: Well Developed, well nourished, and in no acute distress.  Neuro:  Extra-ocular muscles intact, able to move all 4 extremities, sensation grossly intact.  Deep tendon reflexes tested were normal. Psych: Alert and oriented, mood congruent with affect. ENT:  Ears and nose appear unremarkable.  Hearing grossly normal. Neck: Unremarkable overall appearance, trachea midline.  No visible thyroid enlargement. Eyes: Conjunctivae and lids appear unremarkable.  Pupils  equal and round. Skin: Warm and dry, no rashes noted.  Cardiovascular: Pulses palpable, no extremity edema. Left shoulder: Inspection reveals no abnormalities, atrophy or asymmetry. Palpation is normal with no tenderness over AC joint or bicipital groove. ROM is full in all planes. Rotator cuff strength normal throughout. Positive Neer and Hawkin's tests, empty can. Speeds and Yergason's tests normal. No labral pathology noted with negative Obrien's, negative crank, negative clunk, and good stability. Normal scapular function observed. No painful arc and no drop arm sign. No apprehension sign  Impression and Recommendations:   This case required medical decision making of moderate complexity.  Impingement syndrome, shoulder, left Formal physical therapy, x-rays, meloxicam. Return to see me in 6 weeks, injection if no better. ___________________________________________ Ihor Austin. Benjamin Stain, M.D., ABFM., CAQSM. Primary Care and Sports Medicine [facility]  Adjunct Professor of Family Medicine  [employer]

## 2018-02-08 NOTE — Assessment & Plan Note (Signed)
Formal physical therapy, x-rays, meloxicam. Return to see me in 6 weeks, injection if no better.

## 2018-08-05 IMAGING — DX DG CERVICAL SPINE COMPLETE 4+V
5 series · 5 of 5 positions shown · non-contrast
Comparison: None.

CLINICAL DATA: Pt states he has a chronic history of central neck
pain with stiffness in the mornings. States that the pain has
worsened over the past 2 months. Denies any injuries or numbness and
tingling in extremities.

EXAM:
CERVICAL SPINE - COMPLETE 4+ VIEW

[c-spine lat]
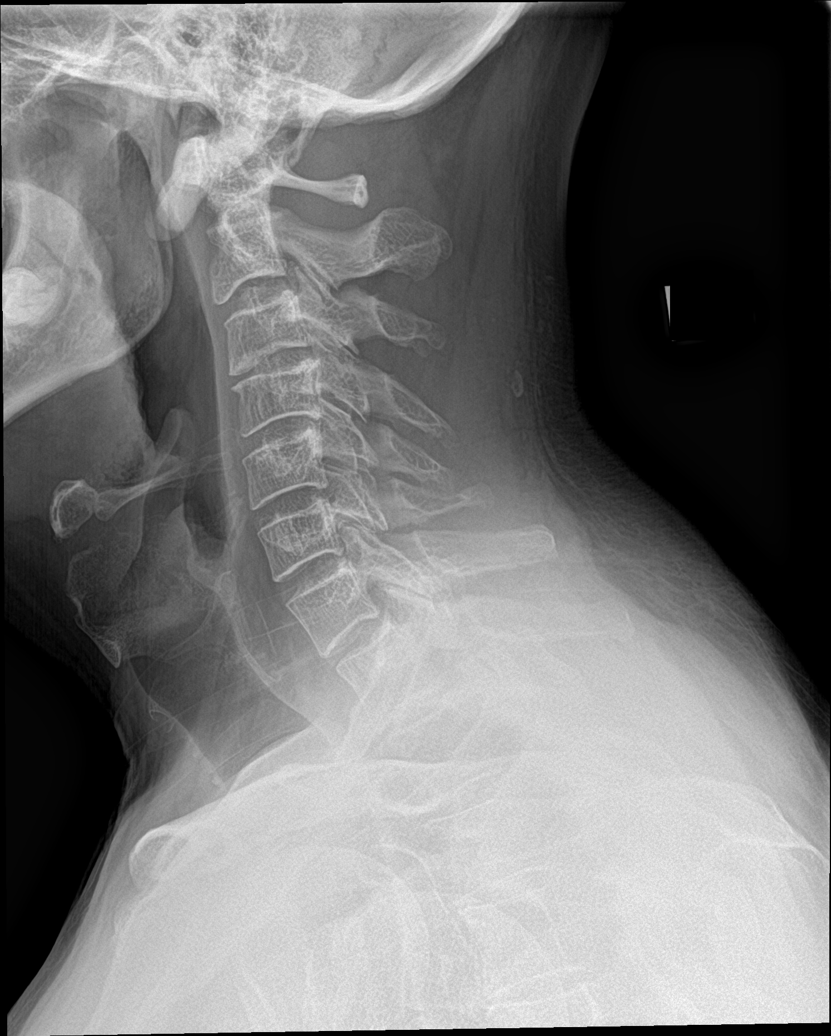

[c-spine obl (1 of 2)]
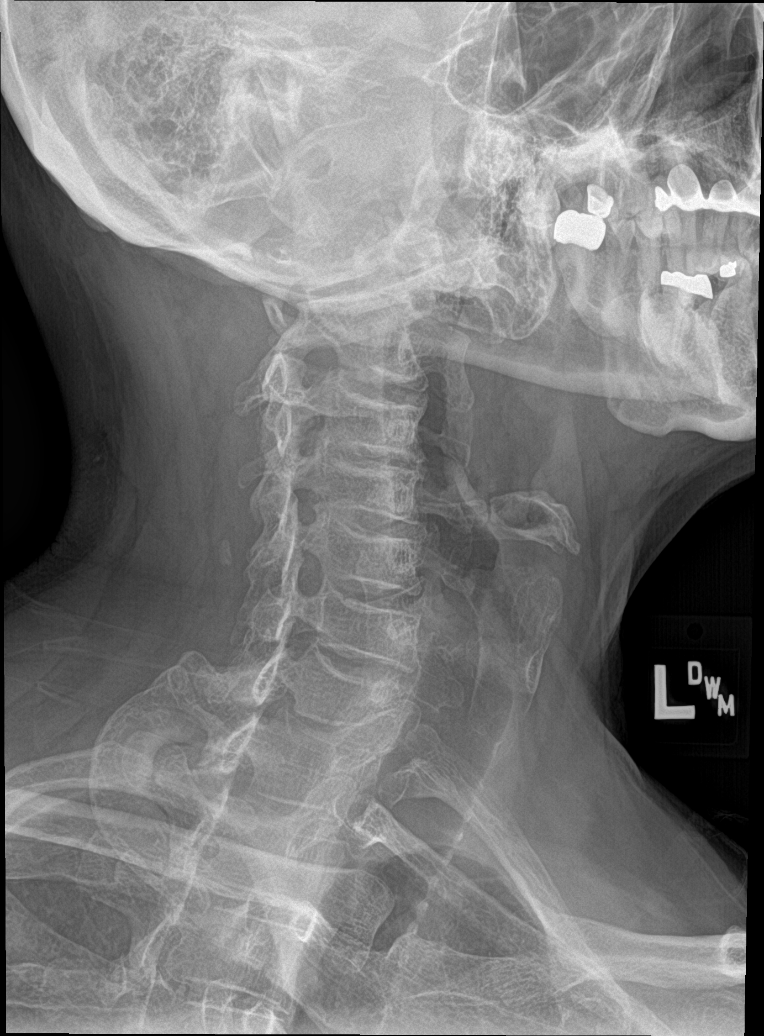

[c-spine obl (2 of 2)]
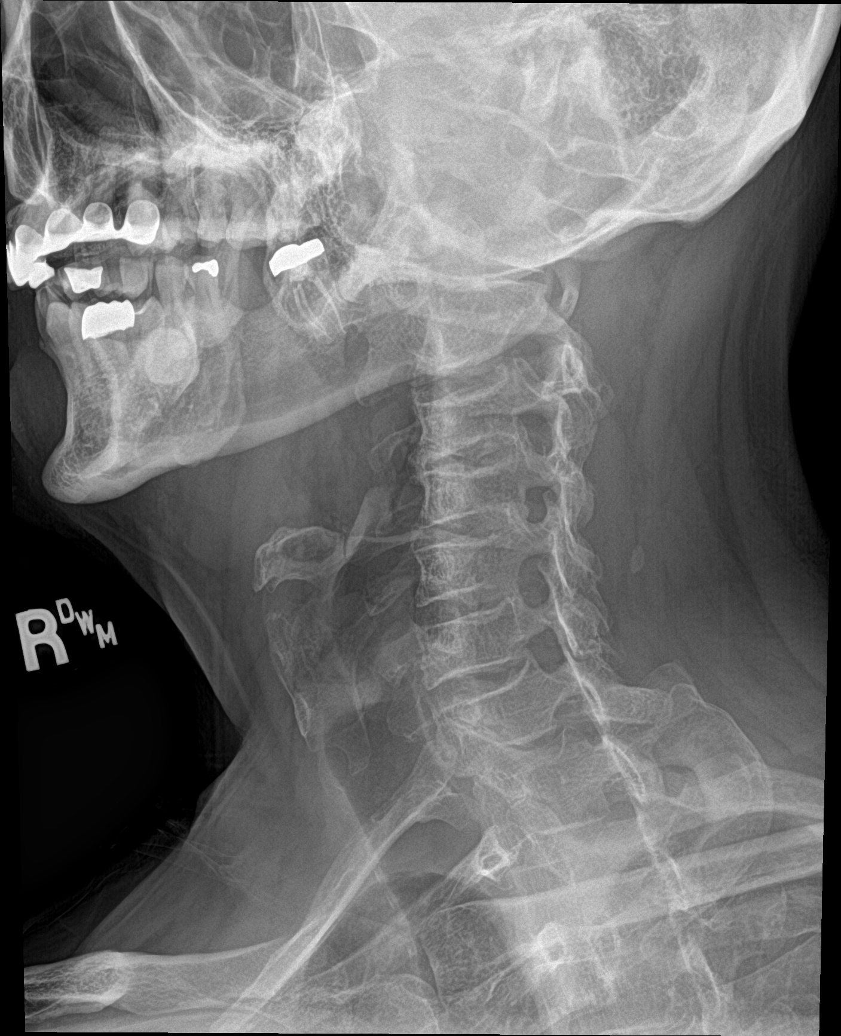

[c-spine ap]
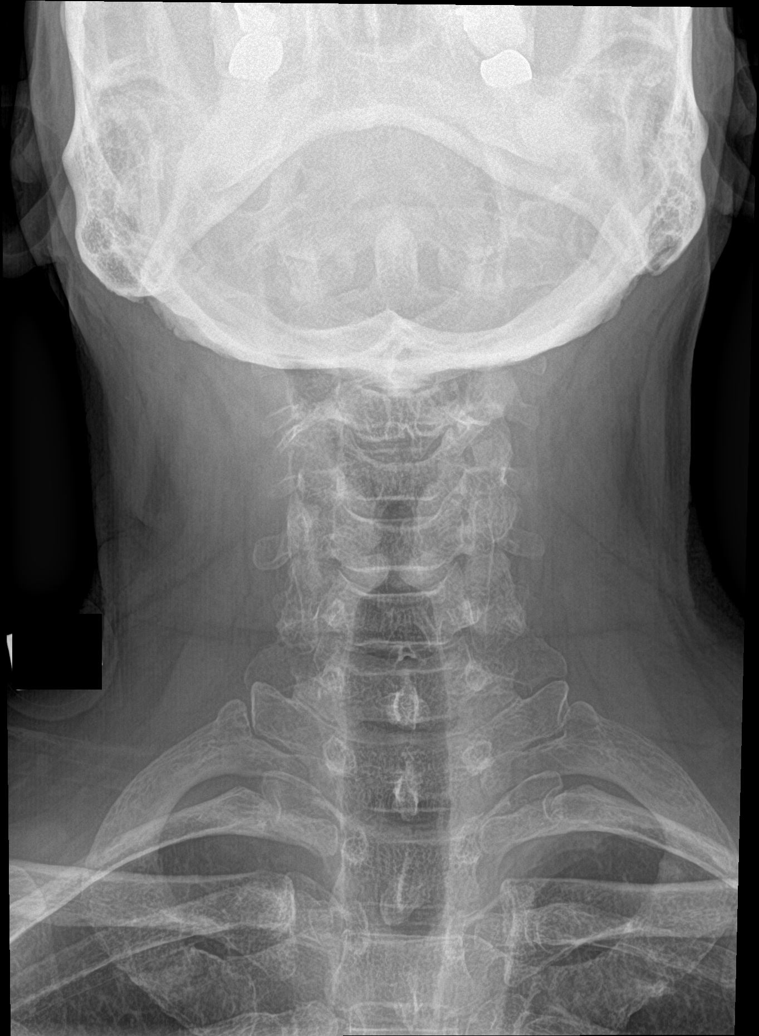

[c-spine open mouth]
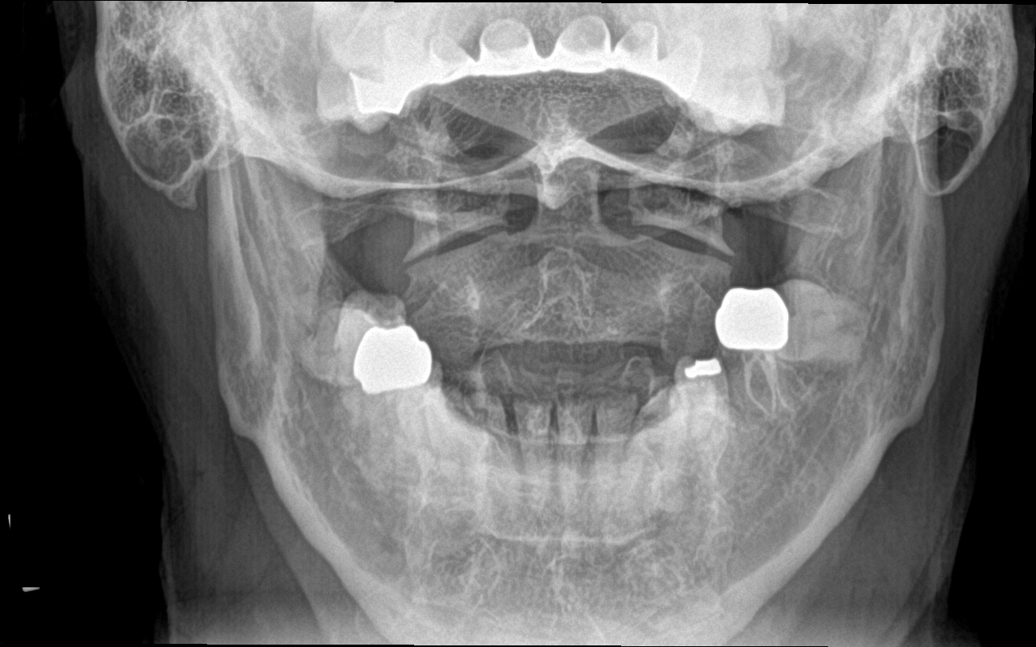

[5 of 5 positions shown; findings below may reference images not displayed]

FINDINGS: There is no evidence of cervical spine fracture or prevertebral soft
tissue swelling. Alignment is normal.

Mild disc desiccation at C3-4 with associated mild disc space
narrowing. No osteophytes or other signs of advanced degenerative
disc disease. No more than mild osseous neural foramen narrowing at
any level. Paravertebral soft tissues are unremarkable.
IMPRESSION: 1. No acute findings.
2. Mild disc desiccation at C3-4. No significant degenerative change
appreciated at any level.

## 2018-08-05 IMAGING — DX DG LUMBAR SPINE COMPLETE 4+V
5 series · 5 of 5 positions shown · non-contrast
Comparison: None.

CLINICAL DATA: Pt states he has a chronic history of central lower
back pain. States that the pain has worsened over the past 2 months.
Denies any injuries or numbness and tingling in extremities.

EXAM:
LUMBAR SPINE - COMPLETE 4+ VIEW

[l-spine ap]
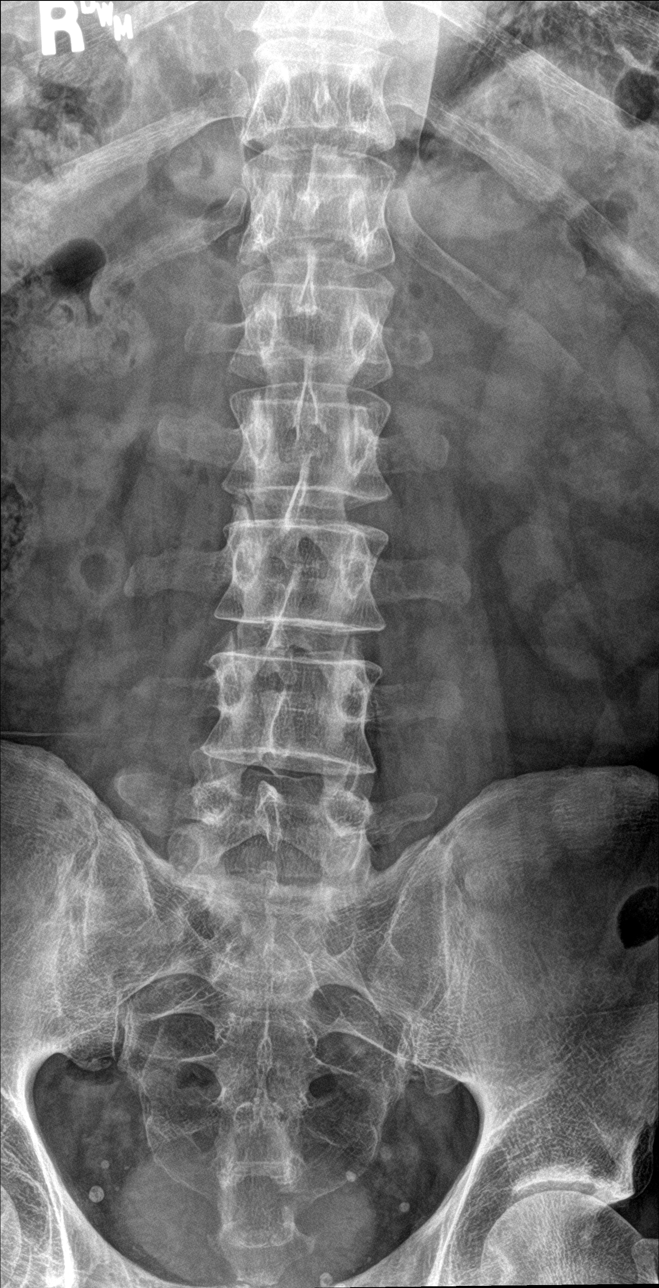

[l-spine obl (1 of 2)]
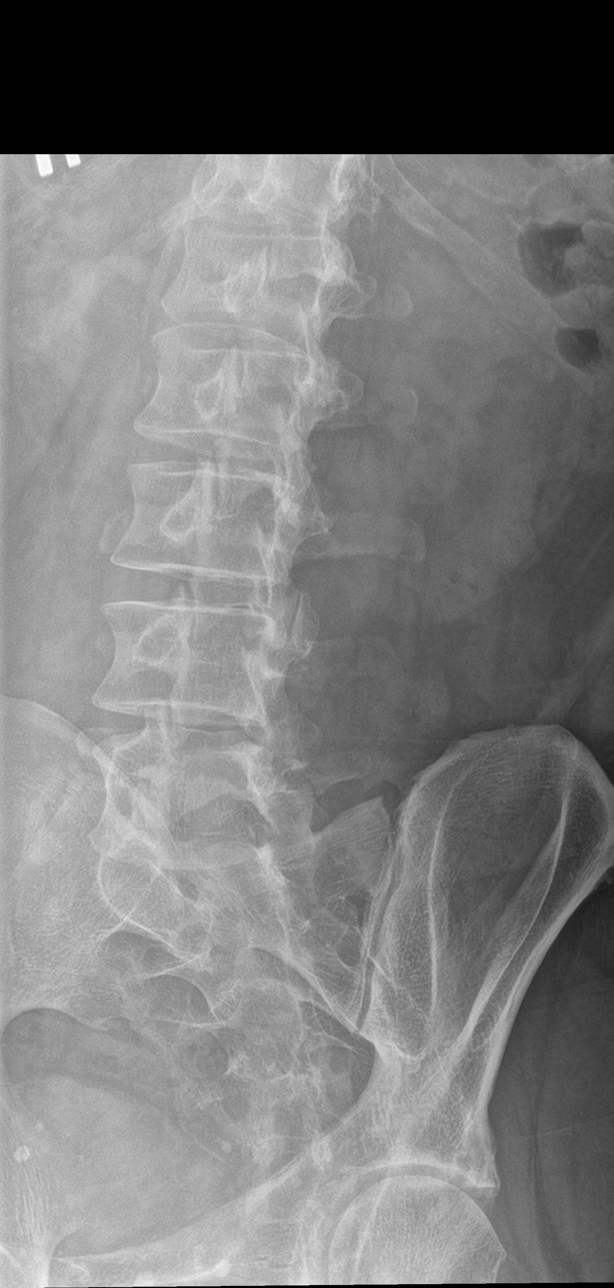

[l-spine obl (2 of 2)]
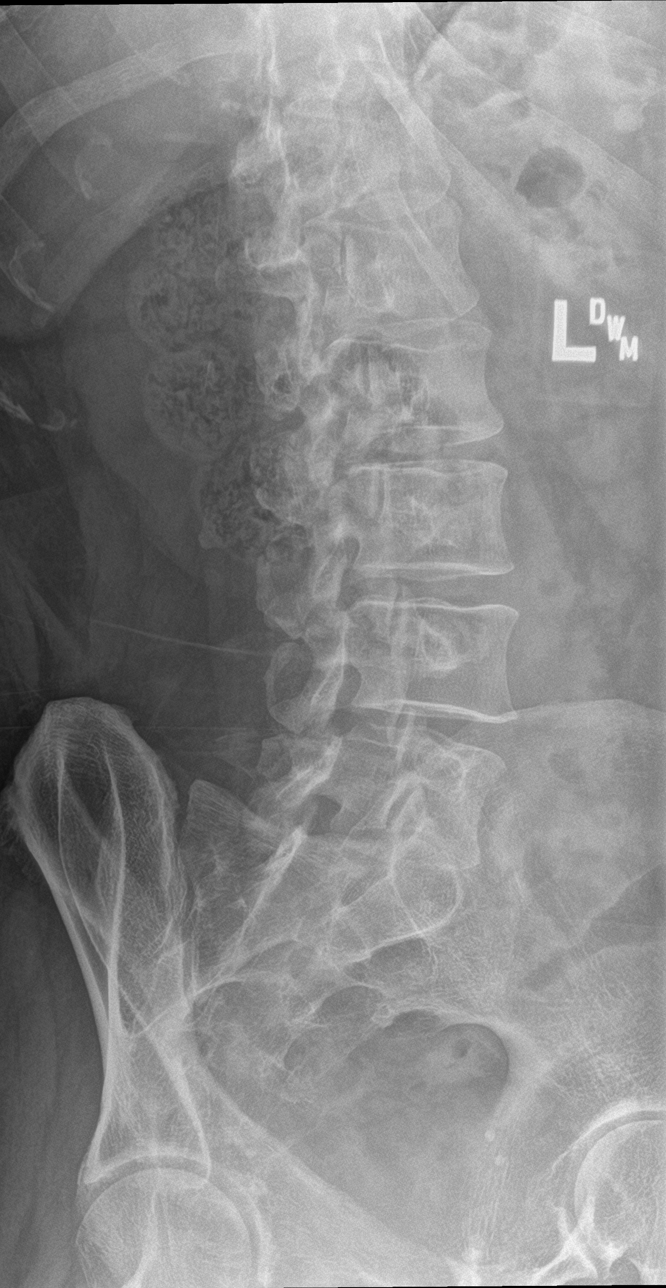

[l-spine lat]
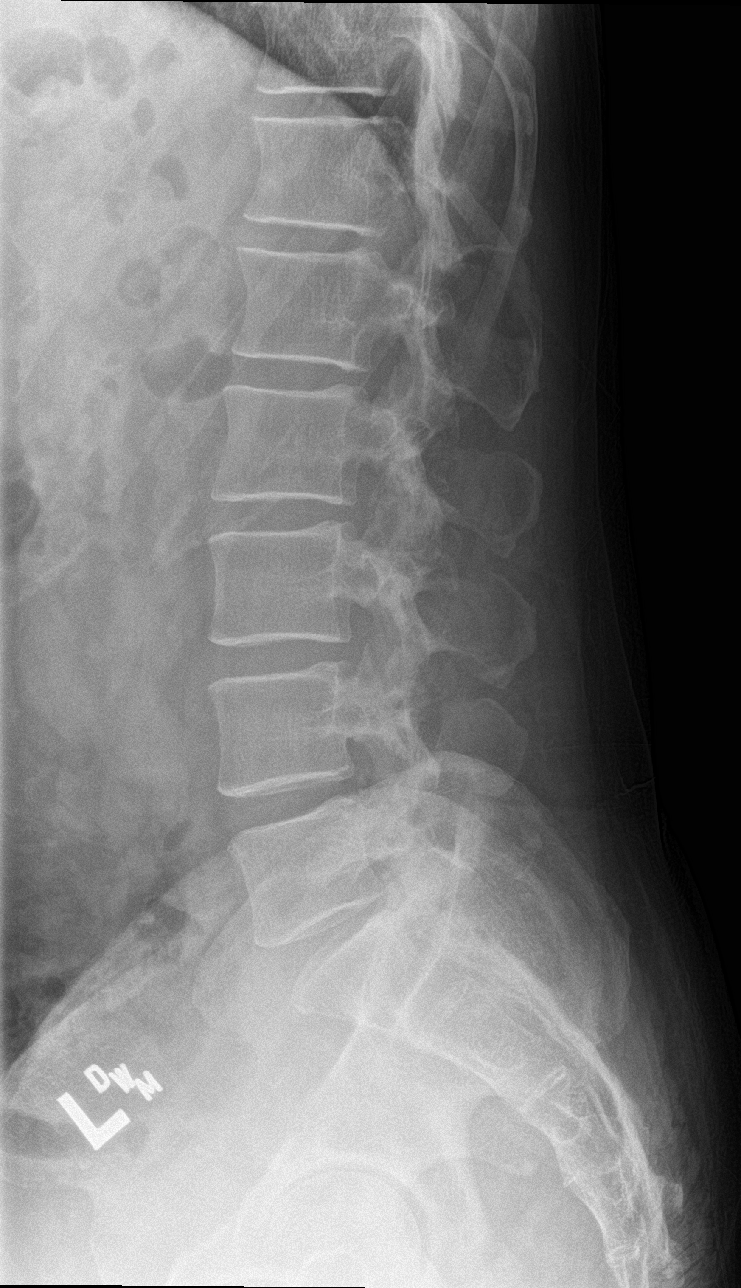

[l-spine spot]
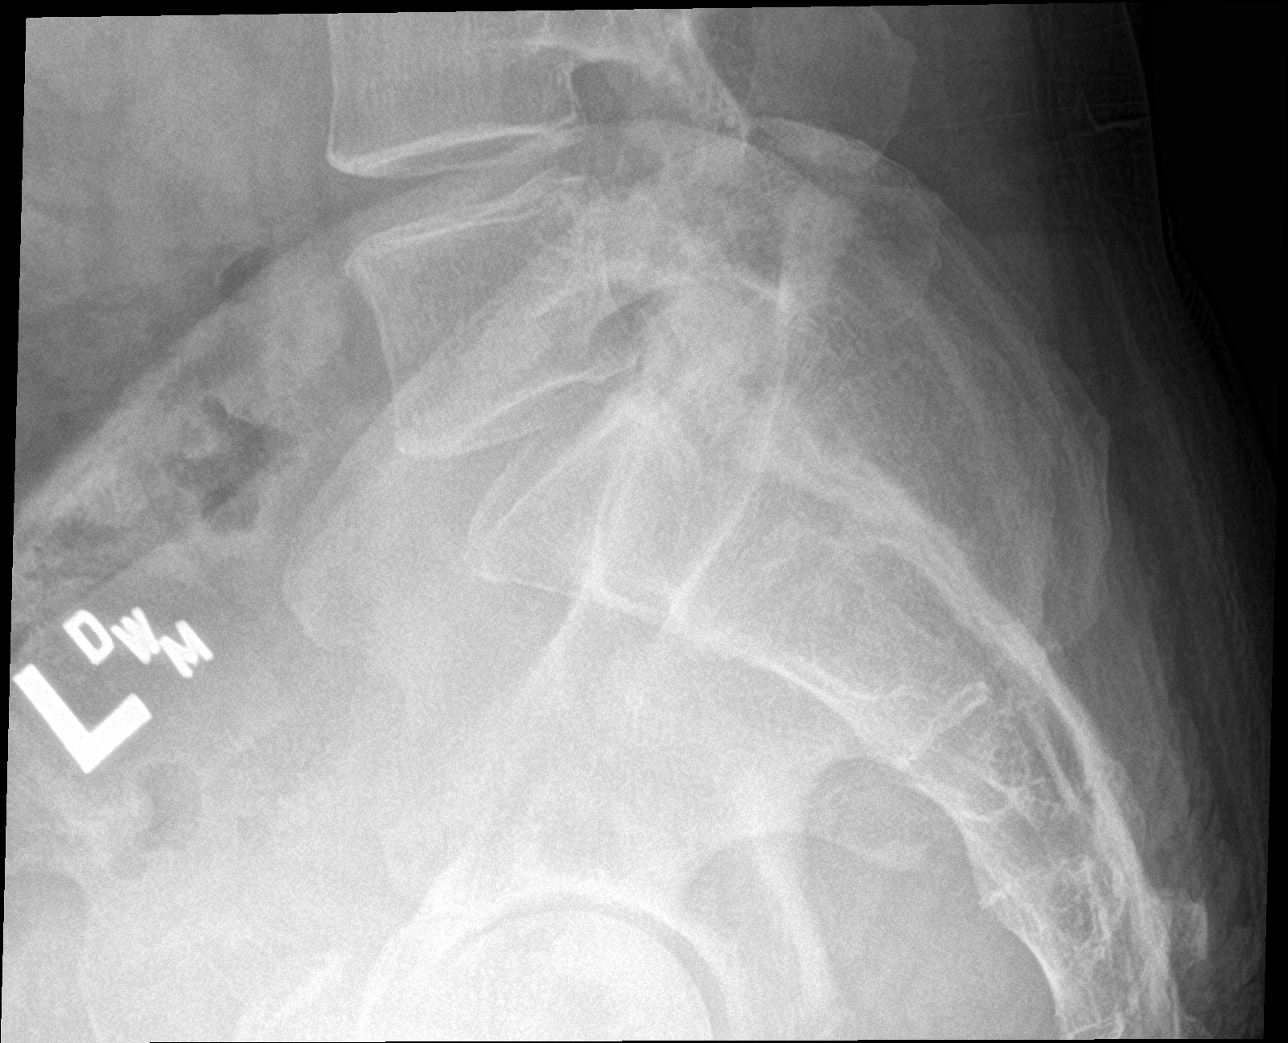

[5 of 5 positions shown; findings below may reference images not displayed]

FINDINGS: Alignment is normal. Disc spaces are well maintained throughout. No
pars interarticularis defect. Paravertebral soft tissues are
unremarkable.
IMPRESSION: Negative.

## 2018-11-09 ENCOUNTER — Other Ambulatory Visit: Payer: Self-pay

## 2018-11-09 ENCOUNTER — Encounter: Payer: Self-pay | Admitting: Family Medicine

## 2018-11-09 ENCOUNTER — Ambulatory Visit (INDEPENDENT_AMBULATORY_CARE_PROVIDER_SITE_OTHER): Payer: 59 | Admitting: Family Medicine

## 2018-11-09 VITALS — BP 102/66 | HR 67 | Temp 97.7°F | Resp 16 | Wt 162.1 lb

## 2018-11-09 DIAGNOSIS — L03012 Cellulitis of left finger: Secondary | ICD-10-CM | POA: Diagnosis not present

## 2018-11-09 MED ORDER — CEPHALEXIN 500 MG PO CAPS: 500.0000 mg | ORAL_CAPSULE | Freq: Three times a day (TID) | ORAL | 0 refills | Status: AC

## 2018-11-09 NOTE — Progress Notes (Signed)
OFFICE VISIT  11/09/2018   CC:  Chief Complaint  Patient presents with  . infected L forefinger   HPI:    Patient is a 58 y.o. Caucasian adult who presents for patient's suspicion of infection of left index finger. Onset about 2 wks ago after he clipped his fingernail.  Was improving but then about 2-3 d/a it took a turn for the worse-->now really hurts when he hits it on anything. Bending finger intact but hurts.  No trauma recalls.  No fevers or malaise. Not soaking it.    Past Medical History:  Diagnosis Date  . Colon polyps    benign  . Heart murmur   . Lateral epicondylitis   . Plantar fasciitis     Past Surgical History:  Procedure Laterality Date  . testicular biopsy    . TONSILLECTOMY      Outpatient Medications Prior to Visit  Medication Sig Dispense Refill  . meloxicam (MOBIC) 15 MG tablet One tab PO qAM with breakfast for 2 weeks, then daily prn pain. 30 tablet 3  . Multiple Vitamin (MULTIVITAMIN) tablet Take 1 tablet by mouth daily.     No facility-administered medications prior to visit.     Allergies  Allergen Reactions  . Shellfish Allergy Nausea And Vomiting     paralyzed and head aches    ROS As per HPI  PE: Blood pressure 102/66, pulse 67, temperature 97.7 F (36.5 C), resp. rate 16, weight 162 lb 2 oz (73.5 kg), SpO2 98 %. Body mass index is 25.39 kg/m. Gen: Alert, well appearing.  Patient is oriented to person, place, time, and situation. AFFECT: pleasant, lucid thought and speech. Left hand index finger with mild erythema and swelling in skin/soft tissue on medial aspect of distal phalanx-->abutting nail fold.  Most tender at medial nail fold.  There is a hint of translucency over a focal area of skin in this region.   No drainage coming from nail fold.  No distal/pulp tenderness or swelling or erythema.  He has good ROM of finger. Nail is normal.  LABS:    Chemistry   No results found for: NA, K, CL, CO2, BUN, CREATININE, GLU No  results found for: CALCIUM, ALKPHOS, AST, ALT, BILITOT   No results found for: WBC, HGB, HCT, MCV, PLT   IMPRESSION AND PLAN:  1) Paronychia of L index finger. Incised 2 mm portion of the most swollen area as well as in the area between nail and nail fold. Only a bit of serosanguinous fluid came out, no pus or cystic fluid.  Pt tolerated procedure well.  Minimal bleeding. No immediate complications.   Discussed wound care. Soak 15 min qd in water + epsom salt. I did send in keflex 500mg  tid x 5d. Signs/symptoms to call or return for were reviewed and pt expressed understanding.   An After Visit Summary was printed and given to the patient.  FOLLOW UP: Return in about 5 days (around 11/14/2018) for f/u L index finger paronychia.  Signed:  Crissie Sickles, MD           11/09/2018

## 2018-11-14 ENCOUNTER — Ambulatory Visit: Payer: 59 | Admitting: Family Medicine

## 2018-11-15 ENCOUNTER — Encounter: Payer: Self-pay | Admitting: Family Medicine

## 2018-11-15 ENCOUNTER — Ambulatory Visit (INDEPENDENT_AMBULATORY_CARE_PROVIDER_SITE_OTHER): Payer: 59 | Admitting: Family Medicine

## 2018-11-15 ENCOUNTER — Other Ambulatory Visit: Payer: Self-pay

## 2018-11-15 VITALS — BP 122/82 | HR 48 | Temp 98.4°F | Resp 16 | Ht 67.0 in | Wt 162.4 lb

## 2018-11-15 DIAGNOSIS — L03012 Cellulitis of left finger: Secondary | ICD-10-CM

## 2018-11-15 NOTE — Progress Notes (Signed)
OFFICE VISIT  11/15/2018   CC:  Chief Complaint  Patient presents with  . Follow-up    L forefinger wound     HPI:    Patient is a 58 y.o. Caucasian adult who presents for 6 d f/u I and D of left index finger paronychia. I put him on mupirocin and 5 d of keflex at the time of procedure last week.  Interim hx: He had progressive pain, redness, and swelling in the finger for a few days after last visit. Ended up having teledoc visit and was rx'd mupirocin and told to soak it in epsom salt baths frequently. It started getting better right away with this and he says it is [facility] better now.  He also says it had an area of "pus" that he had to pop.  No fever or malaise.  Past Medical History:  Diagnosis Date  . Colon polyps    benign  . Heart murmur   . Lateral epicondylitis   . Plantar fasciitis     Past Surgical History:  Procedure Laterality Date  . testicular biopsy    . TONSILLECTOMY      Outpatient Medications Prior to Visit  Medication Sig Dispense Refill  . meloxicam (MOBIC) 15 MG tablet One tab PO qAM with breakfast for 2 weeks, then daily prn pain. 30 tablet 3  . Multiple Vitamin (MULTIVITAMIN) tablet Take 1 tablet by mouth daily.    . mupirocin ointment (BACTROBAN) 2 % Pt uses 3 times daily     No facility-administered medications prior to visit.     Allergies  Allergen Reactions  . Shellfish Allergy Nausea And Vomiting     paralyzed and head aches     ROS As per HPI  PE: Blood pressure 122/82, pulse (!) 48, temperature 98.4 F (36.9 C), temperature source Temporal, resp. rate 16, height 5\' 7"  (1.702 m), weight 162 lb 6.4 oz (73.7 kg), SpO2 98 %. Gen: Alert, well appearing.  Patient is oriented to person, place, time, and situation. AFFECT: pleasant, lucid thought and speech. Left index finger with slight flaky skin defect medially, with small proximal shallow ulceration w/out any exudate, erythema, or swelling.  Nail is normal in appearance.  No  significant tenderness to palpation.  No streaking.  LABS:  None today  IMPRESSION AND PLAN:  Left index finger paronychia: resolving appropriately now. He is s/p I&D, also s/p keflex x 5d.  He'll finish up 3 more days of mupirocin use and frequent epsom salt soaks.  An After Visit Summary was printed and given to the patient.  FOLLOW UP: Return if symptoms worsen or fail to improve.  Signed:  Crissie Sickles, MD           11/15/2018

## 2019-01-30 ENCOUNTER — Encounter: Payer: Self-pay | Admitting: Sports Medicine

## 2019-01-30 ENCOUNTER — Ambulatory Visit (INDEPENDENT_AMBULATORY_CARE_PROVIDER_SITE_OTHER): Payer: 59

## 2019-01-30 ENCOUNTER — Other Ambulatory Visit: Payer: Self-pay

## 2019-01-30 ENCOUNTER — Ambulatory Visit (INDEPENDENT_AMBULATORY_CARE_PROVIDER_SITE_OTHER): Payer: 59 | Admitting: Sports Medicine

## 2019-01-30 DIAGNOSIS — M545 Low back pain, unspecified: Secondary | ICD-10-CM

## 2019-01-30 DIAGNOSIS — G8929 Other chronic pain: Secondary | ICD-10-CM

## 2019-01-30 MED ORDER — PREDNISONE 50 MG PO TABS: ORAL_TABLET | ORAL | 0 refills | Status: DC

## 2019-01-30 NOTE — Progress Notes (Signed)
Subjective:    CC: Low back pain  HPI: Roberto Martin is a pleasant 58 yo with a history significant for Gleason 3+3 prostate cancer who presents today with a 3-4 week hx of low back pain which started when he bent over to pick up a light object. He has found intermittent pain relief with meloxicam which he takes every morning and Ibuprofen 600-800 mg 2-3 times per day. He has had no radiation of pain to his lower extremities, loss of bowel/ bladder function, nor saddle paraesthesias.    I reviewed the past medical history, family history, social history, surgical history, and allergies today and no changes were needed.  Please see the problem list section below in epic for further details.  Past Medical History: Past Medical History:  Diagnosis Date  . Colon polyps    benign  . Heart murmur   . Lateral epicondylitis   . Plantar fasciitis    Past Surgical History: Past Surgical History:  Procedure Laterality Date  . testicular biopsy    . TONSILLECTOMY     Social History: Social History   Socioeconomic History  . Marital status: Married    Spouse name: Not on file  . Number of children: Not on file  . Years of education: Not on file  . Highest education level: Not on file  Occupational History  . Not on file  Social Needs  . Financial resource strain: Not on file  . Food insecurity    Worry: Not on file    Inability: Not on file  . Transportation needs    Medical: Not on file    Non-medical: Not on file  Tobacco Use  . Smoking status: Never Smoker  . Smokeless tobacco: Never Used  Substance and Sexual Activity  . Alcohol use: Yes    Comment: occasional  . Drug use: No  . Sexual activity: Yes    Birth control/protection: None  Lifestyle  . Physical activity    Days per week: Not on file    Minutes per session: Not on file  . Stress: Not on file  Relationships  . Social Herbalist on phone: Not on file    Gets together: Not on file    Attends religious  service: Not on file    Active member of club or organization: Not on file    Attends meetings of clubs or organizations: Not on file    Relationship status: Not on file  Other Topics Concern  . Not on file  Social History Narrative   Married to [place]. No children.   MBA degree, [employer]    No tobacco, drug use. Drinks a glass of wine rarely on special occasions.   Patient does not know father's family history.   Caffeinated beverages. Daily vitamin.   Wears his seat belt and bicycle helmet.   Exercises at least 3 times a week. Patient runs daily.   Smoke detector in the home.   Physical safe in his relationships.   Family History: Family History  Problem Relation Age of Onset  . Heart attack Father   . COPD Mother   . COPD Sister   . Cancer Sister   . Leukemia Sister   . Mental illness Brother   . Hyperlipidemia Neg Hx   . Hypertension Neg Hx   . Sudden death Neg Hx   . Diabetes Neg Hx    Allergies: Allergies  Allergen Reactions  . Shellfish Allergy Nausea And Vomiting  paralyzed and head aches    Medications: See med rec.  Review of Systems: No fevers, chills, night sweats, weight loss, chest pain, or shortness of breath.   Objective:    General: Well Developed, well nourished, and in no acute distress.  Neuro: Alert and oriented x3, extra-ocular muscles intact, sensation grossly intact.  HEENT: Normocephalic, atraumatic,  Skin: Warm and dry, no rashes. Cardiac: Regular rate and rhythm, no lower extremity edema.  Respiratory: Not using accessory muscles, speaking in full sentences.  Back Exam:  Inspection: Unremarkable  Palpable tenderness: None. Sensory change: Gross sensation intact to all lumbar and sacral dermatomes.  Reflexes: hyperreflexive at both patellar tendons, 2+ at achilles tendons, non-sustained clonus noted at bilateral ankles.  Strength at foot  Plantar-flexion: 5/5 Dorsi-flexion: 5/5  Leg strength  Quad: 5/5  Gait unremarkable.   A/P: Findings of lower extremity hyperreflexia and clonus in the setting of atraumatic lower back pain and hx of prostate cancer is concerning for metastasis to the spine. While no lesions were noted on x-ray, a CT of the lumbar and thoracic spine were ordered for a rule out.   Impression and Recommendations:    Chronic low back pain without sciatica Axial discogenic low back pain, however he does have significant hyperreflexia of the lower extremities, nonsustained clonus, and on further questioning a history of prostate cancer with Gleason 3+3. Because of this we are going to check a BMP, he also needs x-rays and a contrast MRI of his lumbar and thoracic spine. Adding 5 days of prednisone, home rehab exercises in the meantime, return to see me in 6 weeks. His urologist is going to be checking a PSA in 3 weeks.   ___________________________________________ Ihor Austin. Benjamin Stain, M.D., ABFM., CAQSM. Primary Care and Sports Medicine [facility]  Adjunct Professor of Family Medicine  [employer]

## 2019-01-30 NOTE — Assessment & Plan Note (Signed)
Axial discogenic low back pain, however he does have significant hyperreflexia of the lower extremities, nonsustained clonus, and on further questioning a history of prostate cancer with Gleason 3+3. Because of this we are going to check a BMP, he also needs x-rays and a contrast MRI of his lumbar and thoracic spine. Adding 5 days of prednisone, home rehab exercises in the meantime, return to see me in 6 weeks. His urologist is going to be checking a PSA in 3 weeks.

## 2019-01-31 ENCOUNTER — Encounter: Payer: Self-pay | Admitting: Sports Medicine

## 2019-01-31 DIAGNOSIS — M545 Low back pain, unspecified: Secondary | ICD-10-CM

## 2019-01-31 DIAGNOSIS — G8929 Other chronic pain: Secondary | ICD-10-CM

## 2019-02-01 NOTE — Telephone Encounter (Signed)
Disregard message - sent in error.

## 2019-02-07 ENCOUNTER — Other Ambulatory Visit: Payer: Self-pay

## 2019-02-07 ENCOUNTER — Ambulatory Visit (INDEPENDENT_AMBULATORY_CARE_PROVIDER_SITE_OTHER): Payer: 59

## 2019-02-07 DIAGNOSIS — G8929 Other chronic pain: Secondary | ICD-10-CM

## 2019-02-07 DIAGNOSIS — M545 Low back pain: Secondary | ICD-10-CM | POA: Diagnosis not present

## 2019-02-07 MED ORDER — GADOBUTROL 1 MMOL/ML IV SOLN
7.5000 mL | Freq: Once | INTRAVENOUS | Status: AC | PRN
  Administered 2019-02-07: 7.5 mL via INTRAVENOUS

## 2019-03-13 ENCOUNTER — Ambulatory Visit: Payer: 59 | Admitting: Sports Medicine

## 2019-03-15 ENCOUNTER — Ambulatory Visit (INDEPENDENT_AMBULATORY_CARE_PROVIDER_SITE_OTHER): Payer: 59 | Admitting: Sports Medicine

## 2019-03-15 ENCOUNTER — Other Ambulatory Visit: Payer: Self-pay

## 2019-03-15 ENCOUNTER — Encounter: Payer: Self-pay | Admitting: Sports Medicine

## 2019-03-15 DIAGNOSIS — M5416 Radiculopathy, lumbar region: Secondary | ICD-10-CM | POA: Diagnosis not present

## 2019-03-15 NOTE — Progress Notes (Signed)
Subjective:    CC: Follow-up  HPI: Roberto Martin returns, he is a pleasant 58 year old male, we have been treating for low back pain, continues to have discomfort, radiating down the left leg in an L5 distribution, moderate, persistent, he is agreeable to proceed with interventional treatment.  I reviewed the past medical history, family history, social history, surgical history, and allergies today and no changes were needed.  Please see the problem list section below in epic for further details.  Past Medical History: Past Medical History:  Diagnosis Date  . Colon polyps    benign  . Heart murmur   . Lateral epicondylitis   . Plantar fasciitis    Past Surgical History: Past Surgical History:  Procedure Laterality Date  . testicular biopsy    . TONSILLECTOMY     Social History: Social History   Socioeconomic History  . Marital status: Married    Spouse name: Not on file  . Number of children: Not on file  . Years of education: Not on file  . Highest education level: Not on file  Occupational History  . Not on file  Social Needs  . Financial resource strain: Not on file  . Food insecurity    Worry: Not on file    Inability: Not on file  . Transportation needs    Medical: Not on file    Non-medical: Not on file  Tobacco Use  . Smoking status: Never Smoker  . Smokeless tobacco: Never Used  Substance and Sexual Activity  . Alcohol use: Yes    Comment: occasional  . Drug use: No  . Sexual activity: Yes    Birth control/protection: None  Lifestyle  . Physical activity    Days per week: Not on file    Minutes per session: Not on file  . Stress: Not on file  Relationships  . Social Musician on phone: Not on file    Gets together: Not on file    Attends religious service: Not on file    Active member of club or organization: Not on file    Attends meetings of clubs or organizations: Not on file    Relationship status: Not on file  Other Topics Concern   . Not on file  Social History Narrative   Married to Roberto Martin. No children.   MBA degree, [employer]    No tobacco, drug use. Drinks a glass of wine rarely on special occasions.   Patient does not know father's family history.   Caffeinated beverages. Daily vitamin.   Wears his seat belt and bicycle helmet.   Exercises at least 3 times a week. Patient runs daily.   Smoke detector in the home.   Physical safe in his relationships.   Family History: Family History  Problem Relation Age of Onset  . Heart attack Father   . COPD Mother   . COPD Sister   . Cancer Sister   . Leukemia Sister   . Mental illness Brother   . Hyperlipidemia Neg Hx   . Hypertension Neg Hx   . Sudden death Neg Hx   . Diabetes Neg Hx    Allergies: Allergies  Allergen Reactions  . Shellfish Allergy Nausea And Vomiting     paralyzed and head aches    Medications: See med rec.  Review of Systems: No fevers, chills, night sweats, weight loss, chest pain, or shortness of breath.   Objective:    General: Well Developed, well nourished, and  in no acute distress.  Neuro: Alert and oriented x3, extra-ocular muscles intact, sensation grossly intact.  HEENT: Normocephalic, atraumatic, pupils equal round reactive to light, neck supple, no masses, no lymphadenopathy, thyroid nonpalpable.  Skin: Warm and dry, no rashes. Cardiac: Regular rate and rhythm, no murmurs rubs or gallops, no lower extremity edema.  Respiratory: Clear to auscultation bilaterally. Not using accessory muscles, speaking in full sentences.  Impression and Recommendations:    Left lumbar radiculitis Left-sided L5 distribution, failed conservative measures, proceeding with left L5-S1 interlaminar epidural, return to see me 1 month after injection to evaluate relief. We did discuss the developmental anthropology of degenerative disc disease in the human spine.   ___________________________________________ Gwen Her. Dianah Field, M.D.,  ABFM., CAQSM. Primary Care and Sports Medicine [facility]  Adjunct Professor of [place] [employer] [facility] [employer] [facility] [employer] Medicine

## 2019-03-15 NOTE — Assessment & Plan Note (Signed)
Left-sided L5 distribution, failed conservative measures, proceeding with left L5-S1 interlaminar epidural, return to see me 1 month after injection to evaluate relief. We did discuss the developmental anthropology of degenerative disc disease in the human spine.

## 2019-03-17 ENCOUNTER — Encounter: Payer: Self-pay | Admitting: Sports Medicine

## 2019-03-20 NOTE — Telephone Encounter (Signed)
Please remind [place] [facility] about him as well.

## 2019-03-27 ENCOUNTER — Encounter: Payer: Self-pay | Admitting: Sports Medicine

## 2019-03-28 ENCOUNTER — Other Ambulatory Visit: Payer: Self-pay

## 2019-03-28 ENCOUNTER — Ambulatory Visit
Admission: RE | Admit: 2019-03-28 | Discharge: 2019-03-28 | Disposition: A | Discharge status: Home / Self Care | Payer: 59 | Source: Ambulatory Visit | Attending: Sports Medicine | Admitting: Sports Medicine

## 2019-03-28 MED ORDER — METHYLPREDNISOLONE ACETATE 40 MG/ML INJ SUSP (RADIOLOG
120.0000 mg | Freq: Once | INTRAMUSCULAR | Status: AC
  Administered 2019-03-28: 120 mg via EPIDURAL

## 2019-03-28 MED ORDER — IOPAMIDOL (ISOVUE-M 200) INJECTION 41%
1.0000 mL | Freq: Once | INTRAMUSCULAR | Status: AC
  Administered 2019-03-28: 1 mL via EPIDURAL

## 2019-03-28 NOTE — Discharge Instructions (Signed)

## 2019-03-30 ENCOUNTER — Other Ambulatory Visit: Payer: 59

## 2019-04-06 DIAGNOSIS — M7542 Impingement syndrome of left shoulder: Secondary | ICD-10-CM

## 2019-04-06 MED ORDER — MELOXICAM 15 MG PO TABS: ORAL_TABLET | ORAL | 3 refills | Status: DC

## 2020-04-29 ENCOUNTER — Other Ambulatory Visit: Payer: Self-pay

## 2020-04-29 ENCOUNTER — Ambulatory Visit (INDEPENDENT_AMBULATORY_CARE_PROVIDER_SITE_OTHER): Payer: 59 | Admitting: Sports Medicine

## 2020-04-29 DIAGNOSIS — M7542 Impingement syndrome of left shoulder: Secondary | ICD-10-CM

## 2020-04-29 DIAGNOSIS — M5416 Radiculopathy, lumbar region: Secondary | ICD-10-CM

## 2020-04-29 MED ORDER — MELOXICAM 15 MG PO TABS: ORAL_TABLET | ORAL | 3 refills | Status: DC

## 2020-04-29 NOTE — Progress Notes (Signed)
    Procedures performed today:    None.  Independent interpretation of notes and tests performed by another provider:   None.  Brief History, Exam, Impression, and Recommendations:    Left lumbar radiculitis Overall doing well, really just needs a refill on meloxicam, did have a L5-S1 interlaminar epidural to provide good relief, can always call in prednisone in the future for acute flares of back pain. Return as needed.    ___________________________________________ Ihor Austin. Benjamin Stain, M.D., ABFM., CAQSM. Primary Care and Sports Medicine [facility]  Adjunct Instructor of Family Medicine  [employer]

## 2020-04-29 NOTE — Assessment & Plan Note (Signed)
Overall doing well, really just needs a refill on meloxicam, did have a L5-S1 interlaminar epidural to provide good relief, can always call in prednisone in the future for acute flares of back pain. Return as needed.

## 2020-07-03 ENCOUNTER — Encounter: Payer: Self-pay | Admitting: Family Medicine

## 2020-07-03 ENCOUNTER — Telehealth: Payer: 59 | Admitting: Family Medicine

## 2020-12-10 ENCOUNTER — Encounter: Payer: Self-pay | Admitting: Family Medicine

## 2020-12-10 ENCOUNTER — Ambulatory Visit (INDEPENDENT_AMBULATORY_CARE_PROVIDER_SITE_OTHER): Payer: 59 | Admitting: Family Medicine

## 2020-12-10 ENCOUNTER — Other Ambulatory Visit: Payer: Self-pay

## 2020-12-10 VITALS — BP 140/86 | Ht 67.0 in | Wt 160.0 lb

## 2020-12-10 DIAGNOSIS — S39012A Strain of muscle, fascia and tendon of lower back, initial encounter: Secondary | ICD-10-CM | POA: Diagnosis not present

## 2020-12-10 MED ORDER — METHYLPREDNISOLONE ACETATE 40 MG/ML IJ SUSP
40.0000 mg | Freq: Once | INTRAMUSCULAR | Status: AC
  Administered 2020-12-10: 40 mg via INTRAMUSCULAR

## 2020-12-10 MED ORDER — KETOROLAC TROMETHAMINE 30 MG/ML IJ SOLN
30.0000 mg | Freq: Once | INTRAMUSCULAR | Status: AC
  Administered 2020-12-10: 30 mg via INTRAMUSCULAR

## 2020-12-10 NOTE — Assessment & Plan Note (Signed)
Acute on chronic in nature.  Seems most muscular in nature. -Counseled on home exercise therapy and supportive care. -IM Toradol and Depo-Medrol -Counseled on compression. -Could consider physical therapy.

## 2020-12-10 NOTE — Progress Notes (Signed)
  Roberto Martin - 60 y.o. male MRN [id number]  Date of birth: December 13, 1960  SUBJECTIVE:  Including CC & ROS.  No chief complaint on file.   Roberto Martin is a 60 y.o. male that is presenting with acute low back pain.  No radicular pain at this point.  Had a similar instance about 3 years ago.  He was lifting things in his garage and felt a twinge.  He is an avid runner.  No history of surgery  Independent review of the lumbar spine x-ray from 2020 shows mild facet changes.   Review of Systems See HPI   HISTORY: Past Medical, Surgical, Social, and Family History Reviewed & Updated per EMR.   Pertinent Historical Findings include:  Past Medical History:  Diagnosis Date   Colon polyps    benign   Heart murmur    Lateral epicondylitis    Plantar fasciitis     Past Surgical History:  Procedure Laterality Date   testicular biopsy     TONSILLECTOMY      Family History  Problem Relation Age of Onset   Heart attack Father    COPD Mother    COPD Sister    Cancer Sister    Leukemia Sister    Mental illness Brother    Hyperlipidemia Neg Hx    Hypertension Neg Hx    Sudden death Neg Hx    Diabetes Neg Hx     Social History   Socioeconomic History   Marital status: Married    Spouse name: Not on file   Number of children: Not on file   Years of education: Not on file   Highest education level: Not on file  Occupational History   Not on file  Tobacco Use   Smoking status: Never   Smokeless tobacco: Never  Substance and Sexual Activity   Alcohol use: Yes    Comment: occasional   Drug use: No   Sexual activity: Yes    Birth control/protection: None  Other Topics Concern   Not on file  Social History Narrative   Married to Roberto Martin. No children.   MBA degree, [employer]    No tobacco, drug use. Drinks a glass of wine rarely on special occasions.   Patient does not know father's family history.   Caffeinated beverages. Daily vitamin.   Wears his seat belt and  bicycle helmet.   Exercises at least 3 times a week. Patient runs daily.   Smoke detector in the home.   Physical safe in his relationships.   Social Determinants of Health   Financial Resource Strain: Not on file  Food Insecurity: Not on file  Transportation Needs: Not on file  Physical Activity: Not on file  Stress: Not on file  Social Connections: Not on file  Intimate Partner Violence: Not on file     PHYSICAL EXAM:  VS: BP 140/86 (BP Location: Left Arm, Patient Position: Sitting, Cuff Size: Normal)   Ht 5\' 7"  (1.702 m)   Wt 160 lb (72.6 kg)   BMI 25.06 kg/m  Physical Exam Gen: NAD, alert, cooperative with exam, well-appearing       ASSESSMENT & PLAN:   Strain of lumbar region Acute on chronic in nature.  Seems most muscular in nature. -Counseled on home exercise therapy and supportive care. -IM Toradol and Depo-Medrol -Counseled on compression. -Could consider physical therapy.

## 2020-12-10 NOTE — Patient Instructions (Signed)
Nice to meet you Please continue heat  Please continue the exercises  You can consider the compression   Please send me a message in MyChart with any questions or updates.  Please see me back in 2 weeks or as needed if better.   --Dr. Jordan Likes

## 2020-12-11 ENCOUNTER — Encounter: Payer: Self-pay | Admitting: Family Medicine

## 2020-12-17 ENCOUNTER — Ambulatory Visit: Payer: 59 | Admitting: Sports Medicine

## 2021-04-17 ENCOUNTER — Ambulatory Visit (INDEPENDENT_AMBULATORY_CARE_PROVIDER_SITE_OTHER): Payer: 59 | Admitting: Family Medicine

## 2021-04-17 ENCOUNTER — Encounter: Payer: Self-pay | Admitting: Family Medicine

## 2021-04-17 DIAGNOSIS — M7542 Impingement syndrome of left shoulder: Secondary | ICD-10-CM | POA: Diagnosis not present

## 2021-04-17 DIAGNOSIS — S39012A Strain of muscle, fascia and tendon of lower back, initial encounter: Secondary | ICD-10-CM

## 2021-04-17 MED ORDER — MELOXICAM 15 MG PO TABS: ORAL_TABLET | ORAL | 3 refills | Status: AC

## 2021-04-17 MED ORDER — KETOROLAC TROMETHAMINE 30 MG/ML IJ SOLN: 30.0000 mg | Freq: Once | INTRAMUSCULAR | Status: AC

## 2021-04-17 MED ORDER — METHYLPREDNISOLONE ACETATE 40 MG/ML IJ SUSP: 40.0000 mg | Freq: Once | INTRAMUSCULAR | Status: AC

## 2021-04-17 NOTE — Patient Instructions (Signed)
Good to see you Please use heat as needed   Please send me a message in MyChart with any questions or updates.  Please see me back as needed.   --Dr. Jordan Likes

## 2021-04-17 NOTE — Progress Notes (Signed)
°  Zyion Doxtater - 60 y.o. male MRN [id number]  Date of birth: 11/09/1960  SUBJECTIVE:  Including CC & ROS.  No chief complaint on file.   Casmere Hollenbeck is a 60 y.o. male that is presenting with acute worsening of his low back pain.  He was bending over and felt a twinge appear.  Since that time the pain is gotten more severe.  Has had a similar episode in August.   Review of Systems See HPI   HISTORY: Past Medical, Surgical, Social, and Family History Reviewed & Updated per EMR.   Pertinent Historical Findings include:  Past Medical History:  Diagnosis Date   Colon polyps    benign   Heart murmur    Lateral epicondylitis    Plantar fasciitis     Past Surgical History:  Procedure Laterality Date   testicular biopsy     TONSILLECTOMY      Family History  Problem Relation Age of Onset   Heart attack Father    COPD Mother    COPD Sister    Cancer Sister    Leukemia Sister    Mental illness Brother    Hyperlipidemia Neg Hx    Hypertension Neg Hx    Sudden death Neg Hx    Diabetes Neg Hx     Social History   Socioeconomic History   Marital status: Married    Spouse name: Not on file   Number of children: Not on file   Years of education: Not on file   Highest education level: Not on file  Occupational History   Not on file  Tobacco Use   Smoking status: Never   Smokeless tobacco: Never  Substance and Sexual Activity   Alcohol use: Yes    Comment: occasional   Drug use: No   Sexual activity: Yes    Birth control/protection: None  Other Topics Concern   Not on file  Social History Narrative   Married to [place]. No children.   MBA degree, C[employer]    No tobacco, drug use. Drinks a glass of wine rarely on special occasions.   Patient does not know father's family history.   Caffeinated beverages. Daily vitamin.   Wears his seat belt and bicycle helmet.   Exercises at least 3 times a week. Patient runs daily.   Smoke detector in the home.    Physical safe in his relationships.   Social Determinants of Health   Financial Resource Strain: Not on file  Food Insecurity: Not on file  Transportation Needs: Not on file  Physical Activity: Not on file  Stress: Not on file  Social Connections: Not on file  Intimate Partner Violence: Not on file     PHYSICAL EXAM:  VS: BP (!) 142/88 (BP Location: Left Arm, Patient Position: Sitting)    Ht 5\' 7"  (1.702 m)    Wt 155 lb (70.3 kg)    BMI 24.28 kg/m  Physical Exam Gen: NAD, alert, cooperative with exam, well-appearing    ASSESSMENT & PLAN:   Strain of lumbar region Acutely worsening.  Has spasm with limited flexion due to pain. -Counseled on home exercise therapy and supportive care. -IM Toradol and Depo-Medrol. -Refilled meloxicam. -Could consider physical therapy.

## 2021-04-17 NOTE — Assessment & Plan Note (Signed)
Acutely worsening.  Has spasm with limited flexion due to pain. -Counseled on home exercise therapy and supportive care. -IM Toradol and Depo-Medrol. -Refilled meloxicam. -Could consider physical therapy.

## 2021-04-22 ENCOUNTER — Ambulatory Visit: Payer: 59 | Admitting: Family Medicine

## 2021-04-28 ENCOUNTER — Ambulatory Visit (INDEPENDENT_AMBULATORY_CARE_PROVIDER_SITE_OTHER): Payer: 59 | Admitting: Family Medicine

## 2021-04-28 ENCOUNTER — Encounter: Payer: Self-pay | Admitting: Family Medicine

## 2021-04-28 VITALS — BP 122/74 | Ht 67.0 in | Wt 155.0 lb

## 2021-04-28 DIAGNOSIS — F430 Acute stress reaction: Secondary | ICD-10-CM | POA: Diagnosis not present

## 2021-04-28 NOTE — Assessment & Plan Note (Signed)
Low suspicion for stress fracture given normal foot exam and no pain elicited with hopping, walking or jogging.  POC u/s in office completed by Dr. Pearletha Forge, results to follow. No fracture visible along 5th metatarsal.   Avoid high impact running for 1-2 weeks Continue with current running shoes and heel inserts when able to restart activity in 1-2 weeks.   Follow up in 3 weeks or sooner if needed

## 2021-04-28 NOTE — Patient Instructions (Signed)
Your exam and ultrasound are reassuring. Wait about 2-3 weeks from the last time you ran to restart running - ideally do so on a treadmill or soft track to test this out. Ok to walk, cycle, use elliptical, swim in meantime. Send me a mychart message to let me know how you're doing.

## 2021-04-28 NOTE — Progress Notes (Signed)
° ° °  SUBJECTIVE:   CHIEF COMPLAINT / HPI: left foot pain  Reports completed 5 mi run a week ago and noticed an ache on lateral side of foot after run.  Started at little toe and radiated upward. Able to weight bear without difficulty. Intermittent ache when standing and walking.  Denies any trauma or injury.  Was able to run again last Thursday but started to ache after 3 miles and has not run since.  Not currently having pain. Has been a runner for more than 50 years.  Reports history of stress fracture of right foot and was concerned that may have recurred on left side as feels similar.  PERTINENT  PMH / PSH:  Stress fracture, right foot  OBJECTIVE:   BP 122/74    Ht 5\' 7"  (1.702 m)    Wt 155 lb (70.3 kg)    BMI 24.28 kg/m    General: Alert, no acute distress Right foot: No edema, ecchymosis or erythema appreciated. No pain elicited along base of 5th metatarsal.  Full ROM without pain. Able to hop without pain. Negative metatarsal squeeze. No supination or pronation noted with jogging or walking   Limited MSK u/s right foot: No cortical irregularity, edema overlying cortex of 5th metatarsal.  ASSESSMENT/PLAN:   Stress reaction Low suspicion for stress fracture given normal foot exam and no pain elicited with hopping, walking or jogging.  POC u/s in office completed by Dr. , no evidence irregularities.   Avoid high impact running for 1-2 more weeks Continue with current running shoes and heel inserts when able to restart activity in 1-2 weeks.   Follow up in 3 weeks or sooner if needed     Pearletha Forge, MD [facility]

## 2021-11-19 ENCOUNTER — Telehealth: Payer: Self-pay

## 2021-11-19 NOTE — Telephone Encounter (Addendum)
Pt called stating that he wanted to re-establish care with Dr. Milinda Cave. Pt was a pt of Dr. Claiborne Billings in 2020. Pt states he does not want to be with the same provider that his wife goes to.  Pt will have to re-establish with Dr. Claiborne Billings because of conflict of interest.

## 2022-01-26 ENCOUNTER — Ambulatory Visit (INDEPENDENT_AMBULATORY_CARE_PROVIDER_SITE_OTHER): Payer: 59 | Admitting: Family Medicine

## 2022-01-26 ENCOUNTER — Encounter: Payer: Self-pay | Admitting: Family Medicine

## 2022-01-26 VITALS — BP 156/86 | Ht 67.0 in | Wt 155.0 lb

## 2022-01-26 DIAGNOSIS — S39012A Strain of muscle, fascia and tendon of lower back, initial encounter: Secondary | ICD-10-CM | POA: Diagnosis not present

## 2022-01-26 MED ORDER — KETOROLAC TROMETHAMINE 30 MG/ML IJ SOLN
30.0000 mg | Freq: Once | INTRAMUSCULAR | Status: AC
  Administered 2022-01-26: 30 mg via INTRAMUSCULAR

## 2022-01-26 MED ORDER — METHYLPREDNISOLONE ACETATE 40 MG/ML IJ SUSP
40.0000 mg | Freq: Once | INTRAMUSCULAR | Status: AC
  Administered 2022-01-26: 40 mg via INTRAMUSCULAR

## 2022-01-26 NOTE — Assessment & Plan Note (Signed)
Acutely occurring.  Symptoms most consistent with spasm -Counseled on home exercise therapy and supportive care. -IM Toradol and Depo-Medrol. -Consider physical therapy.

## 2022-01-26 NOTE — Patient Instructions (Signed)
Good to see you Please continue heat  Please consider compression  Please consider yoga   Please send me a message in MyChart with any questions or updates.  Please see me back as needed.   --Dr. Raeford Razor

## 2022-01-26 NOTE — Progress Notes (Signed)
  Zakry Caso - 61 y.o. male MRN [id number]  Date of birth: Dec 17, 1960  SUBJECTIVE:  Including CC & ROS.  No chief complaint on file.   Dillen Belmontes is a 61 y.o. male that is presenting with acute low back pain.  He initially started feeling the pain 3 days ago.  Similar to his previous episode of back pain.  No radicular pain.    Review of Systems See HPI   HISTORY: Past Medical, Surgical, Social, and Family History Reviewed & Updated per EMR.   Pertinent Historical Findings include:  Past Medical History:  Diagnosis Date   Colon polyps    benign   Heart murmur    Lateral epicondylitis    Plantar fasciitis     Past Surgical History:  Procedure Laterality Date   testicular biopsy     TONSILLECTOMY       PHYSICAL EXAM:  VS: BP (!) 156/86 (BP Location: Left Arm, Patient Position: Sitting)   Ht 5\' 7"  (1.702 m)   Wt 155 lb (70.3 kg)   BMI 24.28 kg/m  Physical Exam Gen: NAD, alert, cooperative with exam, well-appearing MSK:  Neurovascularly intact       ASSESSMENT & PLAN:   Strain of lumbar region Acutely occurring.  Symptoms most consistent with spasm -Counseled on home exercise therapy and supportive care. -IM Toradol and Depo-Medrol. -Consider physical therapy.

## 2022-01-27 ENCOUNTER — Encounter: Payer: Self-pay | Admitting: Family Medicine

## 2022-03-25 ENCOUNTER — Ambulatory Visit (INDEPENDENT_AMBULATORY_CARE_PROVIDER_SITE_OTHER): Payer: 59 | Admitting: Family Medicine

## 2022-03-25 VITALS — BP 110/68 | Ht 67.0 in | Wt 155.0 lb

## 2022-03-25 DIAGNOSIS — M629 Disorder of muscle, unspecified: Secondary | ICD-10-CM | POA: Diagnosis not present

## 2022-03-25 NOTE — Patient Instructions (Signed)
Do hamstring strengthening exercises daily for at least the next 6 weeks. Stretches - hold for 20 seconds when you do these. I would avoid the hamstring sleeve(s) for now. Drink 4 glasses of water throughout the day. Consider weighing yourself before and after exercise - for every pound you lose you should replace with 16-20 ounces of water (and think about hydrating this during your next run instead of after). Follow up with me in 6 weeks or as needed if you're doing well.

## 2022-03-26 ENCOUNTER — Encounter: Payer: Self-pay | Admitting: Family Medicine

## 2022-03-26 NOTE — Progress Notes (Signed)
PCP: [facility]  Subjective:   HPI: Patient is a 61 y.o. male here for bilateral hamstring pain.  Patient denies acute injury. He reports for a few weeks he's had both of his hamstrings tightening up on him. Right one worse than left. No swelling or bruising. Tightening is more in body of left hamstring, proximal in right. No current complaints. He does not monitor hydration, doesn't drink as much water as he should.  Past Medical History:  Diagnosis Date   Colon polyps    benign   Heart murmur    Lateral epicondylitis    Plantar fasciitis     Current Outpatient Medications on File Prior to Visit  Medication Sig Dispense Refill   meloxicam (MOBIC) 15 MG tablet One tab PO qAM with breakfast for 2 weeks, then daily prn pain. 90 tablet 3   Multiple Vitamin (MULTIVITAMIN) tablet Take 1 tablet by mouth daily.     Current Facility-Administered Medications on File Prior to Visit  Medication Dose Route Frequency Provider Last Rate Last Admin   methylPREDNISolone acetate (DEPO-MEDROL) injection 40 mg  40 mg Intramuscular Once Myra Rude, MD        Past Surgical History:  Procedure Laterality Date   testicular biopsy     TONSILLECTOMY      Allergies  Allergen Reactions   Shellfish Allergy Nausea And Vomiting     paralyzed and head aches     BP 110/68   Ht 5\' 7"  (1.702 m)   Wt 155 lb (70.3 kg)   BMI 24.28 kg/m      04/28/2021    1:32 PM  [facility]  Frequency of aerobic exercise (# of days/week) 4  Average time in minutes 50  Frequency of strengthening activities (# of days/week) 3        No data to display              Objective:  Physical Exam:  Gen: NAD, comfortable in exam room  Bilateral legs: No deformity, swelling, bruising, defect. FROM with 5/5 strength knee flexion at 30 and 90 degrees without pain. Mild tenderness to palpation left medial hamstring mid-body.  No other tenderness. NVI  distally.   Assessment & Plan:  1. Bilateral hamstring spasms - shown home strengthening exercises and stretches to do daily.  Discussed proper hydration.  Can evaluate electrolytes if this continues to bother him.  F/u in 6 weeks or prn.

## 2022-04-08 ENCOUNTER — Encounter: Payer: Self-pay | Admitting: Family Medicine

## 2022-04-29 ENCOUNTER — Ambulatory Visit (INDEPENDENT_AMBULATORY_CARE_PROVIDER_SITE_OTHER): Payer: 59 | Admitting: Family Medicine

## 2022-04-29 ENCOUNTER — Encounter: Payer: Self-pay | Admitting: Family Medicine

## 2022-04-29 VITALS — BP 116/64 | Ht 67.0 in | Wt 155.0 lb

## 2022-04-29 DIAGNOSIS — M629 Disorder of muscle, unspecified: Secondary | ICD-10-CM

## 2022-04-29 NOTE — Progress Notes (Signed)
PCP: [place]  Subjective:   HPI: Patient is a 62 y.o. male here for bilateral hamstring pain.  12/6: Patient denies acute injury. He reports for a few weeks he's had both of his hamstrings tightening up on him. Right one worse than left. No swelling or bruising. Tightening is more in body of left hamstring, proximal in right. No current complaints. He does not monitor hydration, doesn't drink as much water as he should.  1/10: Patient reports his hamstrings are doing well. Unfortunately he came down with a sinus infection/URI for a couple weeks after last visit which forced him to rest. But hamstrings have felt good. Doing home exercise program. Ran on Monday 2 miles and felt good as well.  Past Medical History:  Diagnosis Date   Colon polyps    benign   Heart murmur    Lateral epicondylitis    Plantar fasciitis     Current Outpatient Medications on File Prior to Visit  Medication Sig Dispense Refill   meloxicam (MOBIC) 15 MG tablet One tab PO qAM with breakfast for 2 weeks, then daily prn pain. 90 tablet 3   Multiple Vitamin (MULTIVITAMIN) tablet Take 1 tablet by mouth daily.     Current Facility-Administered Medications on File Prior to Visit  Medication Dose Route Frequency Provider Last Rate Last Admin   methylPREDNISolone acetate (DEPO-MEDROL) injection 40 mg  40 mg Intramuscular Once Rosemarie Ax, MD        Past Surgical History:  Procedure Laterality Date   testicular biopsy     TONSILLECTOMY      Allergies  Allergen Reactions   Shellfish Allergy Nausea And Vomiting     paralyzed and head aches     BP 116/64   Ht 5\' 7"  (1.702 m)   Wt 155 lb (70.3 kg)   BMI 24.28 kg/m      04/28/2021    1:32 PM  [facility] Adult Exercise  Frequency of aerobic exercise (# of days/week) 4  Average time in minutes 50  Frequency of strengthening activities (# of days/week) 3        No data to display               Objective:  Physical Exam:  Gen: NAD, comfortable in exam room  Bilateral legs: No deformity. FROM with 5/5 strength knee flexion at 30 and 90 degrees without pain. NVI distally.   Assessment & Plan:  1. Bilateral hamstring spasms - improving.  Continue home exercise program.  Discussed return to running - every other day with cross training on off days.  Follow up as needed

## 2022-06-15 ENCOUNTER — Ambulatory Visit (INDEPENDENT_AMBULATORY_CARE_PROVIDER_SITE_OTHER): Payer: 59 | Admitting: Family Medicine

## 2022-06-15 VITALS — BP 124/82 | Ht 67.0 in | Wt 155.0 lb

## 2022-06-15 DIAGNOSIS — M629 Disorder of muscle, unspecified: Secondary | ICD-10-CM

## 2022-06-15 NOTE — Progress Notes (Signed)
PCP: [place]:   HPI: Patient is a 62 y.o. male here for f/u on bilateral hamstring pain.  At last visit on 04/29/2022 patient was advised to continue with home exercises and start running again every other day with crosstraining on days off.  Today patient states he has been running 3-4 miles 3-4 days/week with no pain during the run but does have some pain in R hamstring the next day, worse with bending over. Describes it as a dull ache.  Past Medical History:  Diagnosis Date   Colon polyps    benign   Heart murmur    Lateral epicondylitis    Plantar fasciitis     Current Outpatient Medications on File Prior to Visit  Medication Sig Dispense Refill   meloxicam (MOBIC) 15 MG tablet One tab PO qAM with breakfast for 2 weeks, then daily prn pain. 90 tablet 3   Multiple Vitamin (MULTIVITAMIN) tablet Take 1 tablet by mouth daily.     Current Facility-Administered Medications on File Prior to Visit  Medication Dose Route Frequency Provider Last Rate Last Admin   methylPREDNISolone acetate (DEPO-MEDROL) injection 40 mg  40 mg Intramuscular Once Rosemarie Ax, MD         BP 124/82   Ht '5\' 7"'$  (1.702 m)   Wt 155 lb (70.3 kg)   BMI 24.28 kg/m       Objective:  Physical Exam:  Gen: awake, alert, NAD, comfortable in exam room Pulm: breathing unlabored Bilateral lower extremities: - Inspection: no gross deformity. No swelling/effusion - Palpation: no TTP of hamstring muscle bellies bilaterally - ROM: full active ROM with flexion and extension in knee  - Strength: 5/5 strength bilaterally with flexion at 90 and 30 degrees and extension of knee  - Neuro/vasc: NV intact   Assessment & Plan:  1. Hamstring Spasms As pain is a persistent, can consider that this could be radicular pain.  Will not get imaging at this time as it would not change management.  Will incorporate isometric abdominal and low back exercises and to home exercise regimen.   Patient advised it is safe for him to continue running.    Precious Gilding, DO Family medicine resident, PGY-2

## 2022-08-03 ENCOUNTER — Encounter: Payer: Self-pay | Admitting: *Deleted

## 2022-09-09 ENCOUNTER — Ambulatory Visit (HOSPITAL_BASED_OUTPATIENT_CLINIC_OR_DEPARTMENT_OTHER)
Admission: RE | Admit: 2022-09-09 | Discharge: 2022-09-09 | Disposition: A | Discharge status: Home / Self Care | Payer: 59 | Source: Ambulatory Visit | Attending: Family Medicine | Admitting: Family Medicine

## 2022-09-09 ENCOUNTER — Other Ambulatory Visit: Payer: Self-pay

## 2022-09-09 DIAGNOSIS — R059 Cough, unspecified: Secondary | ICD-10-CM | POA: Diagnosis present

## 2022-11-09 ENCOUNTER — Ambulatory Visit (INDEPENDENT_AMBULATORY_CARE_PROVIDER_SITE_OTHER): Payer: Managed Care, Other (non HMO) | Admitting: Family Medicine

## 2022-11-09 VITALS — BP 120/72 | Ht 67.0 in | Wt 153.0 lb

## 2022-11-09 DIAGNOSIS — M25562 Pain in left knee: Secondary | ICD-10-CM

## 2022-11-09 NOTE — Patient Instructions (Signed)
You have patellofemoral syndrome. Avoid painful activities when possible (often deep squats, lunges, leg press bother this). Cross train with swimming, cycling with low resistance, elliptical if needed. Straight leg raise, hip side raises, straight leg raises with foot turned outwards 3 sets of 10 once a day. Add ankle weight if these become too easy. Consider formal physical therapy. Avoid flat shoes, barefoot walking as much as possible. Icing 15 minutes at a time 3-4 times a day as needed. Tylenol or ibuprofen as needed for pain. Follow up with me in 6 weeks.

## 2022-11-10 ENCOUNTER — Encounter: Payer: Self-pay | Admitting: Family Medicine

## 2022-11-10 NOTE — Progress Notes (Signed)
PCP: Sheliah Hatch, PA-C  Subjective:   HPI: Patient is a 62 y.o. male here for left knee pain.  Patient reports 1 month of anterior knee pain. Pain off and on. Feels it around his patella. Some at beginning of runs but able to do these without having to stop. No swelling, giving out, locking.  Past Medical History:  Diagnosis Date   Colon polyps    benign   Heart murmur    Lateral epicondylitis    Plantar fasciitis     Current Outpatient Medications on File Prior to Visit  Medication Sig Dispense Refill   meloxicam (MOBIC) 15 MG tablet One tab PO qAM with breakfast for 2 weeks, then daily prn pain. 90 tablet 3   Multiple Vitamin (MULTIVITAMIN) tablet Take 1 tablet by mouth daily.     Current Facility-Administered Medications on File Prior to Visit  Medication Dose Route Frequency Provider Last Rate Last Admin   methylPREDNISolone acetate (DEPO-MEDROL) injection 40 mg  40 mg Intramuscular Once Myra Rude, MD        Past Surgical History:  Procedure Laterality Date   testicular biopsy     TONSILLECTOMY      Allergies  Allergen Reactions   Shellfish Allergy Nausea And Vomiting     paralyzed and head aches     BP 120/72   Ht 5\' 7"  (1.702 m)   Wt 153 lb (69.4 kg)   BMI 23.96 kg/m      04/28/2021    1:32 PM  [facility]  Frequency of aerobic exercise (# of days/week) 4  Average time in minutes 50  Frequency of strengthening activities (# of days/week) 3        No data to display              Objective:  Physical Exam:  Gen: NAD, comfortable in exam room  Left knee: No gross deformity, ecchymoses, swelling. VMO atrophy No TTP joint lines.  Minimal discomfort post patellar facets. FROM with normal strength. Negative ant/post drawers. Negative valgus/varus testing. Negative lachman.  Negative mcmurrays, apleys. NV intact distally.   Assessment & Plan:  1. Left knee pain - 2/2 patellofemoral syndrome.  History  and exam reassuring.  Home exercise program reviewed.  Discussed things to avoid.  Icing, tylenol or ibuprofen only if needed.  F/u in 6 weeks.

## 2022-11-12 ENCOUNTER — Ambulatory Visit: Payer: 59 | Admitting: Family Medicine

## 2023-03-11 ENCOUNTER — Ambulatory Visit (INDEPENDENT_AMBULATORY_CARE_PROVIDER_SITE_OTHER): Payer: Managed Care, Other (non HMO) | Admitting: Family Medicine

## 2023-03-11 VITALS — BP 112/72 | Ht 67.0 in | Wt 155.0 lb

## 2023-03-11 DIAGNOSIS — M25562 Pain in left knee: Secondary | ICD-10-CM

## 2023-03-11 NOTE — Patient Instructions (Signed)
You have patellofemoral syndrome. It's possible some mild underlying knee arthritis is contributing to this. No restrictions on running. Continue home exercises but focus on the VMO exercise. Consider taking aleve or ibuprofen with food a couple hours before exercise. Follow up with me in 6 weeks. Consider injection if not improving.

## 2023-03-12 ENCOUNTER — Encounter: Payer: Self-pay | Admitting: Family Medicine

## 2023-03-12 NOTE — Progress Notes (Signed)
PCP: Sheliah Hatch, PA-C  Subjective:   HPI: Patient is a 62 y.o. male here for left knee pain.  7/22: Patient reports 1 month of anterior knee pain. Pain off and on. Feels it around his patella. Some at beginning of runs but able to do these without having to stop. No swelling, giving out, locking.  11/21: Patient reports continued ache anterior left knee. Bothers only after running a few miles and not during run at all. Doing home exercise program 3-4 times a week. No swelling, injury.  Past Medical History:  Diagnosis Date   Colon polyps    benign   Heart murmur    Lateral epicondylitis    Plantar fasciitis     Current Outpatient Medications on File Prior to Visit  Medication Sig Dispense Refill   meloxicam (MOBIC) 15 MG tablet One tab PO qAM with breakfast for 2 weeks, then daily prn pain. 90 tablet 3   Multiple Vitamin (MULTIVITAMIN) tablet Take 1 tablet by mouth daily.     Current Facility-Administered Medications on File Prior to Visit  Medication Dose Route Frequency Provider Last Rate Last Admin   methylPREDNISolone acetate (DEPO-MEDROL) injection 40 mg  40 mg Intramuscular Once Myra Rude, MD        Past Surgical History:  Procedure Laterality Date   testicular biopsy     TONSILLECTOMY      Allergies  Allergen Reactions   Shellfish Allergy Nausea And Vomiting     paralyzed and head aches     BP 112/72   Ht 5\' 7"  (1.702 m)   Wt 155 lb (70.3 kg)   BMI 24.28 kg/m      04/28/2021    1:32 PM  [facility]  Frequency of aerobic exercise (# of days/week) 4  Average time in minutes 50  Frequency of strengthening activities (# of days/week) 3        No data to display              Objective:  Physical Exam:  Gen: NAD, comfortable in exam room  Left knee: VMO atrophy still noted. No gross deformity, ecchymoses, swelling. Minimal TTP post patellar facets.  No other tenderness. FROM with normal  strength. Negative ant/post drawers. Negative valgus/varus testing. Negative lachman.  Negative mcmurrays, apleys.  NV intact distally.   Assessment & Plan:  1. Left knee pain - 2/2 patellofemoral syndrome.  Possible mild underlying arthritis.  He still has notable VMO atrophy despite his exercises - stressed importance of VMO strengthening for this condition.  Ok to continue running.  Aleve or ibuprofen if needed.  F/u in 6 weeks.  Consider intraarticular injection if not improving.

## 2023-04-22 ENCOUNTER — Ambulatory Visit: Payer: Managed Care, Other (non HMO) | Admitting: Family Medicine

## 2023-05-06 ENCOUNTER — Ambulatory Visit: Payer: Managed Care, Other (non HMO) | Admitting: Family Medicine

## 2023-10-21 ENCOUNTER — Encounter: Payer: Self-pay | Admitting: Podiatry

## 2023-10-21 ENCOUNTER — Ambulatory Visit (INDEPENDENT_AMBULATORY_CARE_PROVIDER_SITE_OTHER): Admitting: Podiatry

## 2023-10-21 DIAGNOSIS — L03032 Cellulitis of left toe: Secondary | ICD-10-CM

## 2023-10-21 NOTE — Progress Notes (Signed)
  Subjective:  Patient ID: Roberto Martin, male    DOB: 1960-08-19,   MRN: [id number]  Chief Complaint  Patient presents with   Nail Problem    I've got a toe that is giving me some trouble.  The doctor at [facility] said it looks like it's been damaged, ingrown toenail.  There has been some seepage.  He put me on an antibiotic, Doxycycline . (Left hallux)    63 y.o. male presents for concern of left great toenail ingrown nail. Relates this has been ongoing for several weeks. Has seen his PCP and given antibtioics has been on a couple rounds but continues to cause trouble and drain. Does relate he is a big runner.   . Denies any other pedal complaints. Denies n/v/f/c.   Past Medical History:  Diagnosis Date   Colon polyps    benign   Heart murmur    Lateral epicondylitis    Plantar fasciitis     Objective:  Physical Exam: Vascular: DP/PT pulses 2/4 bilateral. CFT <3 seconds. Normal hair growth on digits. No edema.  Skin. No lacerations or abrasions bilateral feet. Left hallux nail loosened from nail bed with underlying drainage noted. Mild erythema and edema noted.  Musculoskeletal: MMT 5/5 bilateral lower extremities in DF, PF, Inversion and Eversion. Deceased ROM in DF of ankle joint.  Neurological: Sensation intact to light touch.   Assessment:   1. Paronychia of great toe of left foot      Plan:  Patient was evaluated and treated and all questions answered. Discussed ingrown toenails etiology and treatment options including procedure for removal vs conservative care.  Patient requesting removal of ingrown nail today. Procedure below.  Discussed procedure and post procedure care and patient expressed understanding.  Will follow-up in 2 weeks for nail check or sooner if any problems arise.    Procedure:  Procedure: total Nail Avulsion of left hallux nail Surgeon: Asberry Failing, DPM  Pre-op Dx: Ingrown toenail without infection Post-op: Same  Place of Surgery: Office exam  room.  Indications for surgery: Painful and ingrown toenail.    The patient is requesting removal of nail without  chemical matrixectomy. Risks and complications were discussed with the patient for which they understand and written consent was obtained. Under sterile conditions a total of 3 mL of  1% lidocaine plain was infiltrated in a hallux block fashion. Once anesthetized, the skin was prepped in sterile fashion. A tourniquet was then applied. Next the entire left hallux nail was removed and area copiously irrigated. Silvadene was applied. A dry sterile dressing was applied. After application of the dressing the tourniquet was removed and there is found to be an immediate capillary refill time to the digit. The patient tolerated the procedure well without any complications. Post procedure instructions were discussed the patient for which he verbally understood. Follow-up in two weeks for nail check or sooner if any problems are to arise. Discussed signs/symptoms of infection and directed to call the office immediately should any occur or go directly to the emergency room. In the meantime, encouraged to call the office with any questions, concerns, changes symptoms.   Asberry Failing, DPM

## 2023-10-21 NOTE — Patient Instructions (Signed)

## 2023-10-29 ENCOUNTER — Encounter: Payer: Self-pay | Admitting: Podiatry

## 2023-11-12 ENCOUNTER — Ambulatory Visit: Admitting: Podiatry

## 2023-11-12 ENCOUNTER — Encounter: Payer: Self-pay | Admitting: Podiatry

## 2023-11-12 DIAGNOSIS — L03032 Cellulitis of left toe: Secondary | ICD-10-CM

## 2023-11-12 NOTE — Progress Notes (Signed)
  Subjective:  Patient ID: Roberto Martin, male    DOB: 01-23-1961,   MRN: [id number]  Chief Complaint  Patient presents with   Nail Problem    I think it's doing okay.  I'm still wearing a bandaid.  It's still a little sensitive.    63 y.o. male presents for follow-up of left hallux nail avulsion. Relates doing well some sensitivity noted . Denies any other pedal complaints. Denies n/v/f/c.   Past Medical History:  Diagnosis Date   Colon polyps    benign   Heart murmur    Lateral epicondylitis    Plantar fasciitis     Objective:  Physical Exam: Vascular: DP/PT pulses 2/4 bilateral. CFT <3 seconds. Normal hair growth on digits. No edema.  Skin. No lacerations or abrasions bilateral feet. Left hallux nail healing well. No signs of infection.  Musculoskeletal: MMT 5/5 bilateral lower extremities in DF, PF, Inversion and Eversion. Deceased ROM in DF of ankle joint.  Neurological: Sensation intact to light touch.   Assessment:   1. Paronychia of great toe of left foot      Plan:  Patient was evaluated and treated and all questions answered. Toe was evaluated and appears to be healing well.  May discontinue soaks and neosporin.  Patient to follow-up as needed.    Asberry Failing, DPM

## 2024-03-27 ENCOUNTER — Ambulatory Visit: Admitting: Family Medicine

## 2024-04-03 ENCOUNTER — Ambulatory Visit: Admitting: Family Medicine

## 2024-04-03 VITALS — BP 112/78 | Ht 67.0 in | Wt 153.0 lb

## 2024-04-03 DIAGNOSIS — M25552 Pain in left hip: Secondary | ICD-10-CM

## 2024-04-03 NOTE — Patient Instructions (Signed)
 Your exam is reassuring. This is consistent with arthritis of your hip. These are the different medications you can take for this: Tylenol 500mg  1-2 tabs three times a day for pain. Voltaren gel, capsaicin, aspercreme, or biofreeze topically up to four times a day may also help with pain. Some supplements that may help for arthritis: Boswellia extract, curcumin, pycnogenol Ibuprofen 600mg  three times a day with food as needed. It's important that you continue to stay active - continue running as you have been. Hip strengthening as well as hip flexor/quad/hamstring stretching daily. Consider physical therapy to strengthen muscles around the joint that hurts to take pressure off of the joint itself. Heat or ice 15 minutes at a time 3-4 times a day as needed to help with pain. Follow up with me as needed.

## 2024-04-03 NOTE — Progress Notes (Unsigned)
 PCP: Frederik Charleston, MD  Patient is a 63 y.o. male here for left hip pain.  Left hip pain Patient notes that he has been doing twice daily lymphedema wraps for his wipe on his knees and has noticed some mild discomfort when getting up from the kneeling position. He primarily notices stiffness in the morning and finds that irritating. As he gets moving in the morning, it loosens up and feels better. Pain does not bother him when he runs, but he notices increased subsequent stiffness later that night or the following morning. Patient commonly runs 4-5 miles 2-3 times per week. Patient states that his muscles feel very tight after he runs, does a bit of stretching after, but minimal. Denies paraesthesias and numbness, including over lateral thigh.  Patient takes ibuprofen PRN 3-4 times/week with good relief.  No history of hip surgery, trauma, or falls/impacts.  No injuries leading up to this onset of pain.  Patient does have a history of back spasms and was treated for lower thoracic and lumbar disc protrusion in 2020.  [facility] for patellofemoral syndrome.  Past Medical History:  Diagnosis Date   Colon polyps    benign   Heart murmur    Lateral epicondylitis    Plantar fasciitis     Medications Ordered Prior to Encounter[1]  Past Surgical History:  Procedure Laterality Date   testicular biopsy     TONSILLECTOMY      Allergies[2]  BP 112/78   Ht 5' 7 (1.702 m)   Wt 153 lb (69.4 kg)   BMI 23.96 kg/m      04/28/2021    1:32 PM  [facility]  Frequency of aerobic exercise (# of days/week) 4  Average time in minutes 50  Frequency of strengthening activities (# of days/week) 3        No data to display              Objective:  Physical Exam:  Gen: NAD, comfortable in exam room  Location: Left hip - Inspection: No edema - Palpation: No TTP over femur, ASIS, SI joint - ROM: Diminished passive  internal rotation past 110 deg with pain, diminished passive external rotation past 100 deg, flexion and extension normal - Strength: 5/5 extension/flexion and internal/external rotation - Special Tests: Mild lateral hip pain with FADIR, negative logroll, negative lateral leg raise, negative hop test - Neurovascular: Intact distally   Assessment and Plan:  ***     [1]  Current Outpatient Medications on File Prior to Visit  Medication Sig Dispense Refill   doxycycline  (VIBRAMYCIN ) 100 MG capsule 100 mg 2 (two) times daily. (Patient not taking: Reported on 11/12/2023)     meloxicam  (MOBIC ) 15 MG tablet One tab PO qAM with breakfast for 2 weeks, then daily prn pain. (Patient taking differently: 15 mg. One tab PO qAM with breakfast for 2 weeks, then daily prn pain.) 90 tablet 3   Multiple Vitamin (MULTIVITAMIN) tablet Take 1 tablet by mouth daily.     Current Facility-Administered Medications on File Prior to Visit  Medication Dose Route Frequency Provider Last Rate Last Admin   methylPREDNISolone  acetate (DEPO-MEDROL ) injection 40 mg  40 mg Intramuscular Once Chick Venetia BRAVO, MD      [2]  Allergies Allergen Reactions   Shellfish Allergy Nausea And Vomiting     paralyzed and head aches
